# Patient Record
Sex: Female | Born: 1955
Health system: Southern US, Community
[De-identification: ages and names within clinical notes are randomized; demographics above are authoritative.]

## PROBLEM LIST (undated history)

## (undated) DIAGNOSIS — J45909 Unspecified asthma, uncomplicated: Secondary | ICD-10-CM

## (undated) DIAGNOSIS — R569 Unspecified convulsions: Secondary | ICD-10-CM

## (undated) DIAGNOSIS — H9209 Otalgia, unspecified ear: Secondary | ICD-10-CM

## (undated) DIAGNOSIS — G2581 Restless legs syndrome: Secondary | ICD-10-CM

## (undated) DIAGNOSIS — C801 Malignant (primary) neoplasm, unspecified: Secondary | ICD-10-CM

## (undated) DIAGNOSIS — E7800 Pure hypercholesterolemia, unspecified: Secondary | ICD-10-CM

## (undated) HISTORY — PX: CHOLECYSTECTOMY: SHX55

## (undated) HISTORY — PX: ABDOMINAL HYSTERECTOMY: SHX81

## (undated) HISTORY — PX: INNER EAR SURGERY: SHX679

## (undated) HISTORY — PX: NASAL SINUS SURGERY: SHX719

---

## 2004-08-07 ENCOUNTER — Emergency Department: Payer: Self-pay | Admitting: Unknown Physician Specialty

## 2004-08-19 ENCOUNTER — Emergency Department: Payer: Self-pay | Admitting: Emergency Medicine

## 2004-11-09 ENCOUNTER — Emergency Department: Payer: Self-pay | Admitting: Emergency Medicine

## 2004-11-28 ENCOUNTER — Ambulatory Visit: Payer: Self-pay | Admitting: Family Medicine

## 2005-01-21 ENCOUNTER — Emergency Department: Payer: Self-pay | Admitting: Emergency Medicine

## 2005-03-10 ENCOUNTER — Emergency Department: Payer: Self-pay | Admitting: Emergency Medicine

## 2005-05-23 ENCOUNTER — Emergency Department: Payer: Self-pay | Admitting: Emergency Medicine

## 2005-05-23 ENCOUNTER — Other Ambulatory Visit: Payer: Self-pay

## 2005-06-26 ENCOUNTER — Emergency Department: Payer: Self-pay | Admitting: Emergency Medicine

## 2005-08-08 ENCOUNTER — Other Ambulatory Visit: Payer: Self-pay

## 2005-08-08 ENCOUNTER — Emergency Department: Payer: Self-pay | Admitting: Emergency Medicine

## 2005-12-18 ENCOUNTER — Emergency Department: Payer: Self-pay | Admitting: Emergency Medicine

## 2006-01-31 ENCOUNTER — Emergency Department: Payer: Self-pay | Admitting: Unknown Physician Specialty

## 2006-04-01 ENCOUNTER — Emergency Department: Payer: Self-pay | Admitting: Emergency Medicine

## 2006-04-03 ENCOUNTER — Emergency Department: Payer: Self-pay | Admitting: Emergency Medicine

## 2006-10-05 ENCOUNTER — Emergency Department: Payer: Self-pay | Admitting: Internal Medicine

## 2007-02-10 ENCOUNTER — Other Ambulatory Visit: Payer: Self-pay

## 2007-02-10 ENCOUNTER — Emergency Department: Payer: Self-pay | Admitting: Emergency Medicine

## 2007-02-24 ENCOUNTER — Other Ambulatory Visit: Payer: Self-pay

## 2007-02-24 ENCOUNTER — Emergency Department: Payer: Self-pay | Admitting: Emergency Medicine

## 2007-03-14 ENCOUNTER — Ambulatory Visit: Payer: Self-pay | Admitting: Family Medicine

## 2007-03-22 ENCOUNTER — Ambulatory Visit: Payer: Self-pay | Admitting: Family Medicine

## 2007-12-22 IMAGING — CR DG CHEST 2V
1 series · 2 of 2 positions shown · non-contrast
Comparison: none

REASON FOR EXAM: cough chest pain
COMMENTS:   LMP: Post Hysterectomy

[Series 1: view not recorded · 0.17mm/px · 2 of 2 slices shown]
[im 1/2]
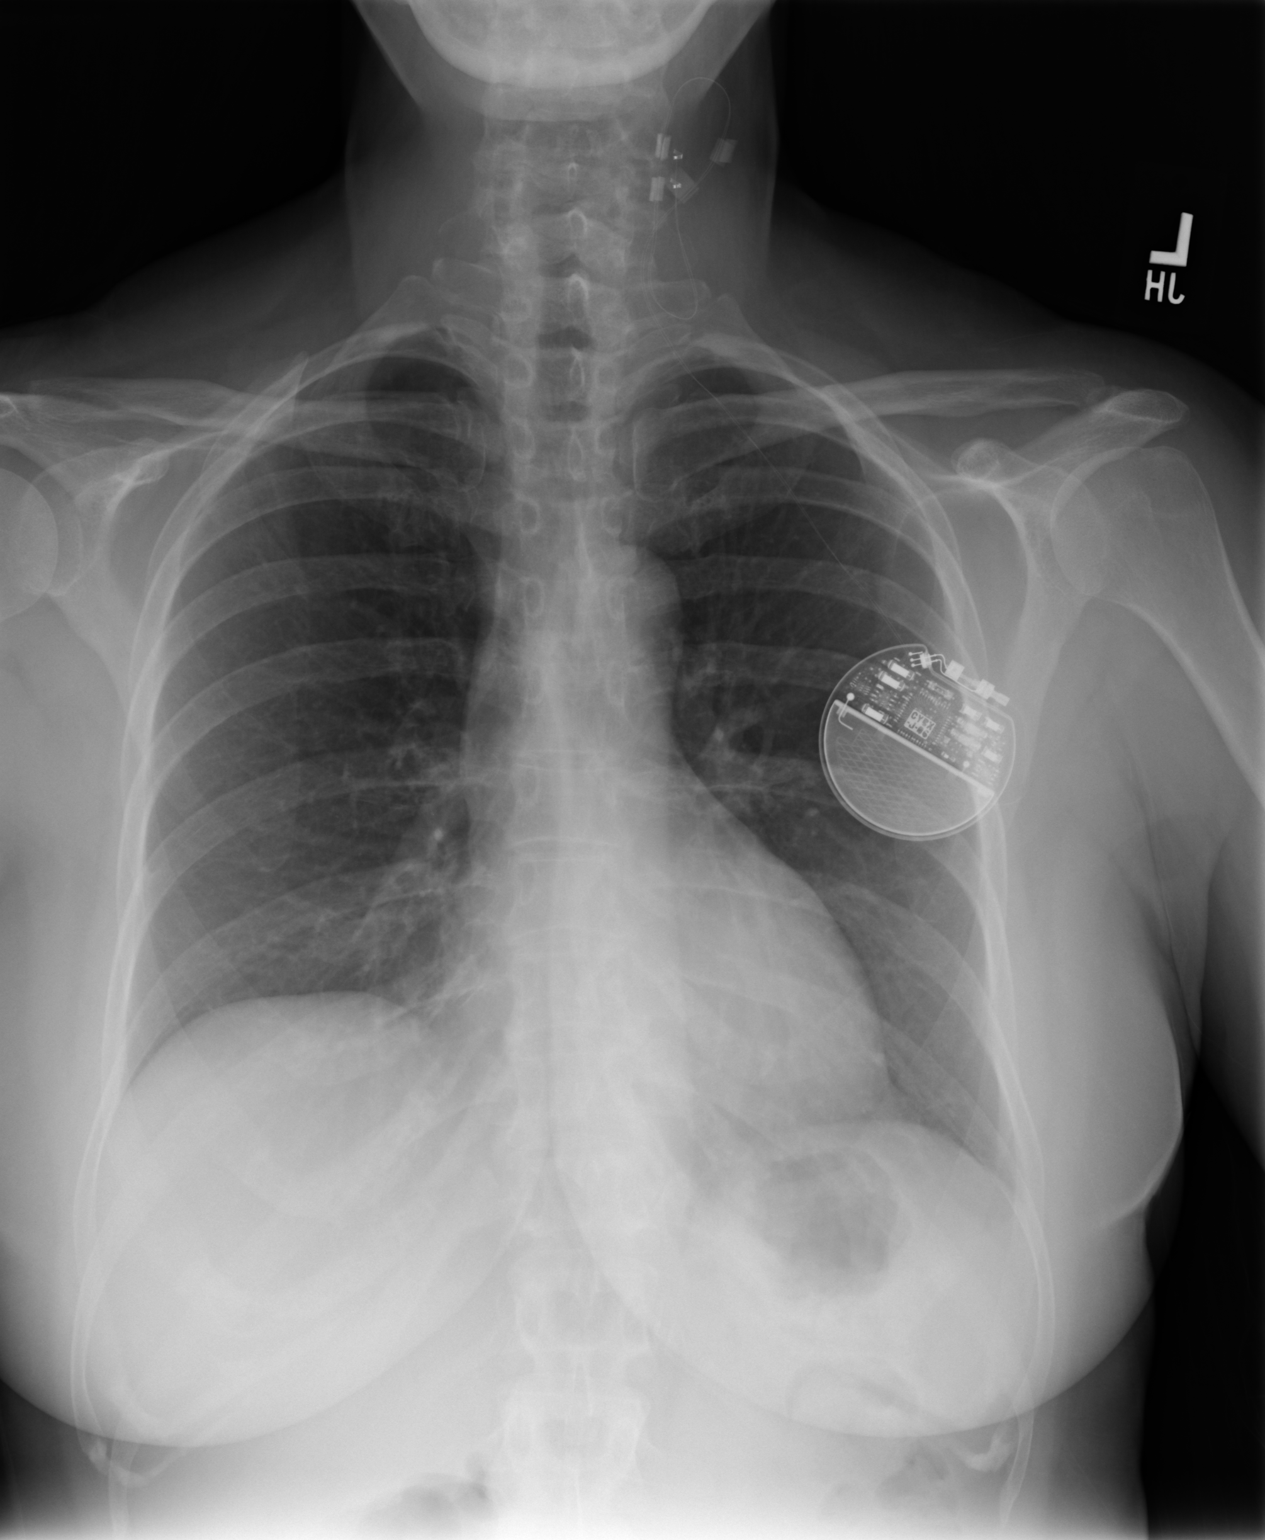
[im 2/2]
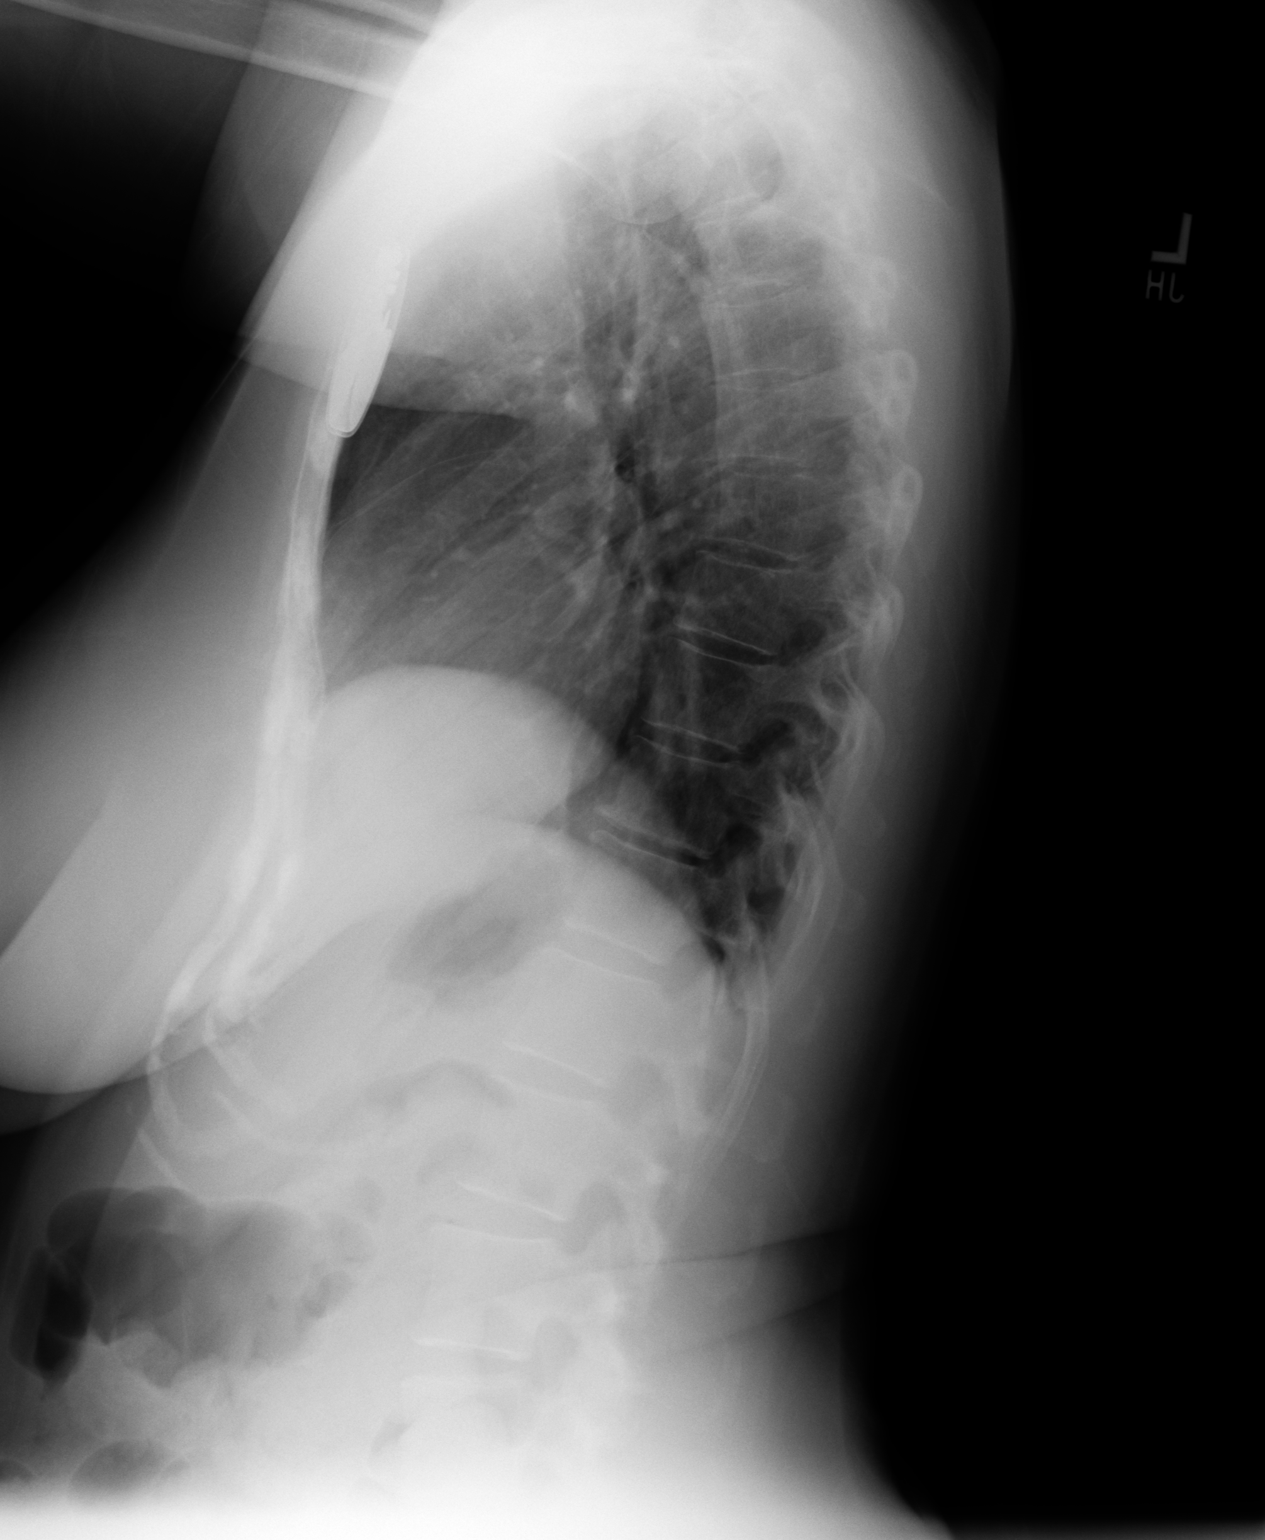

[2 of 2 positions shown; findings below may reference images not displayed]

PROCEDURE:     DXR - DXR CHEST PA (OR AP) AND LATERAL  - February 24, 2007  [DATE]

RESULT:     Comparison is made to study 11 February, 2007. The lungs are
adequately inflated and clear. The heart is not enlarged. The pulmonary
vascularity is not engorged. There is a pacemaker like generator projecting
over the LEFT upper hemithorax but there is no evidence of a pacemaker lead.
IMPRESSION: 1.I do not see evidence of acute cardiopulmonary abnormality.

## 2008-01-11 ENCOUNTER — Emergency Department: Payer: Self-pay | Admitting: Emergency Medicine

## 2008-01-12 ENCOUNTER — Ambulatory Visit: Payer: Self-pay | Admitting: Family Medicine

## 2008-01-22 ENCOUNTER — Emergency Department: Payer: Self-pay | Admitting: Emergency Medicine

## 2008-04-08 ENCOUNTER — Ambulatory Visit: Payer: Self-pay | Admitting: Emergency Medicine

## 2008-06-01 ENCOUNTER — Ambulatory Visit: Payer: Self-pay | Admitting: Family Medicine

## 2008-12-05 ENCOUNTER — Inpatient Hospital Stay: Payer: Self-pay | Admitting: Surgery

## 2008-12-23 ENCOUNTER — Ambulatory Visit: Payer: Self-pay | Admitting: Family Medicine

## 2008-12-25 ENCOUNTER — Emergency Department: Payer: Self-pay | Admitting: Unknown Physician Specialty

## 2008-12-27 ENCOUNTER — Emergency Department: Payer: Self-pay | Admitting: Emergency Medicine

## 2009-10-22 IMAGING — US US PELV - US TRANSVAGINAL
1 series · 18 of 18 positions shown · non-contrast
Comparison: none

REASON FOR EXAM: left lower abdominal pain
COMMENTS:

PROCEDURE:     US  - US PELVIS MASS EXAM W/TRANSVAGI  - December 25, 2008 [DATE]
RESULT:     The ovaries are not visualized. The patient has had a prior
hysterectomy. No hydronephrosis is noted. No pelvic fluid collections are
noted.

[Series 1: us pelv - us transvaginal · 18 of 18 slices shown]
[im 1/18]
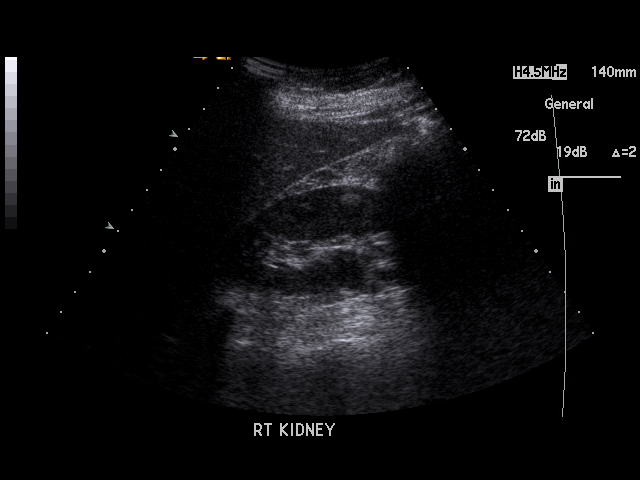
[im 2/18]
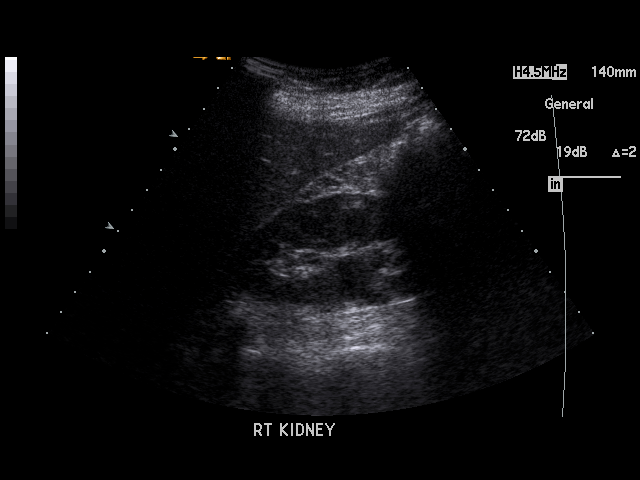
[im 3/18]
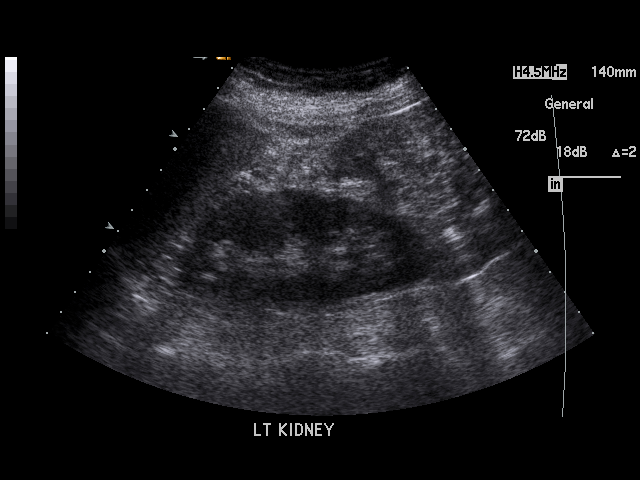
[im 4/18]
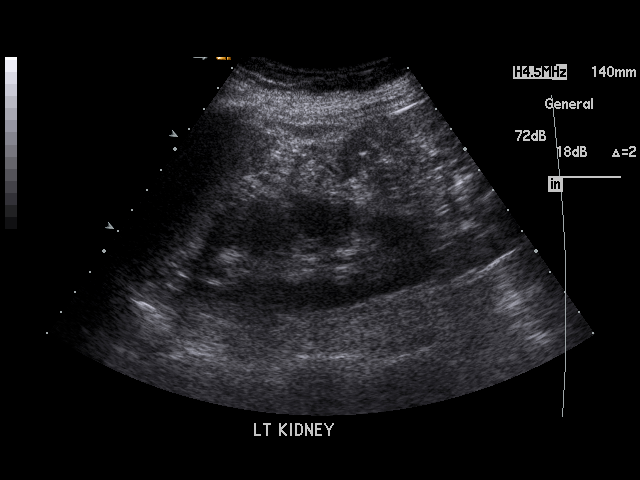
[im 5/18]
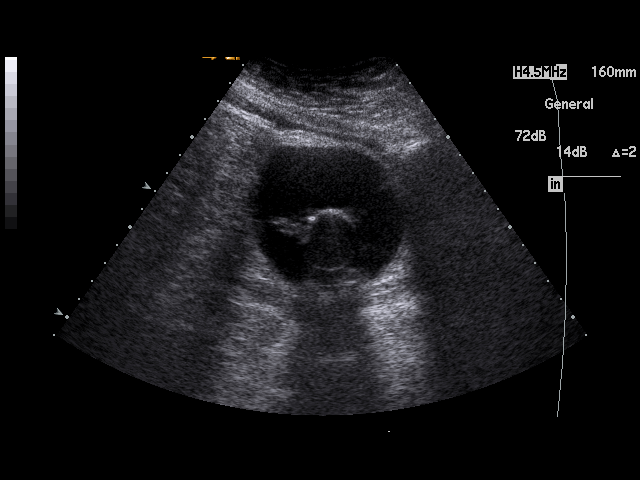
[im 6/18]
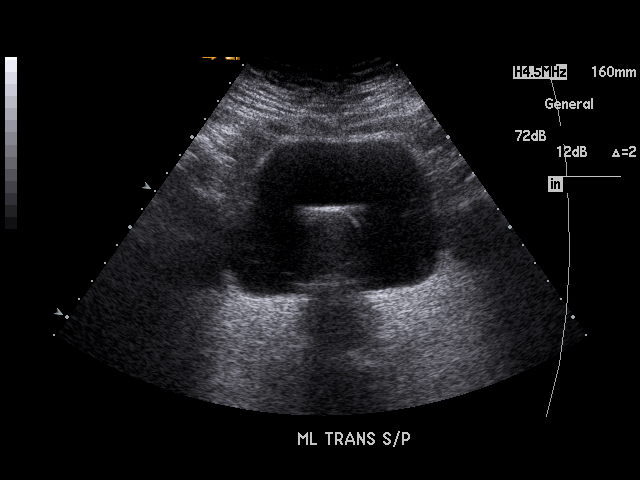
[im 7/18]
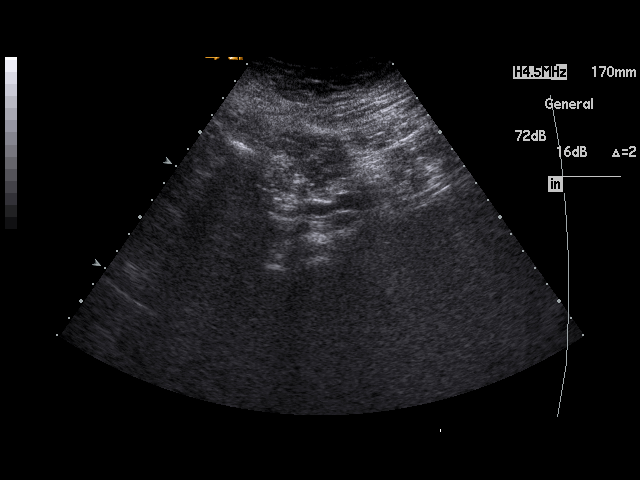
[im 8/18]
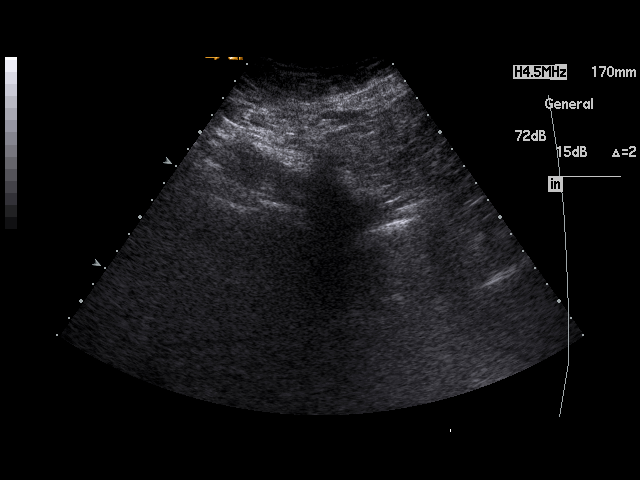
[im 9/18]
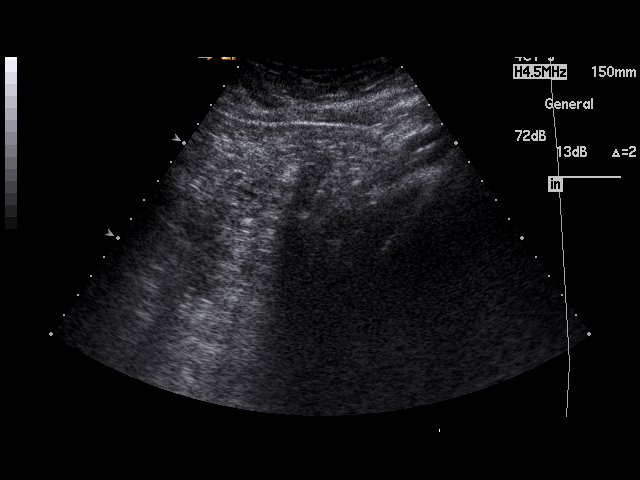
[im 10/18]
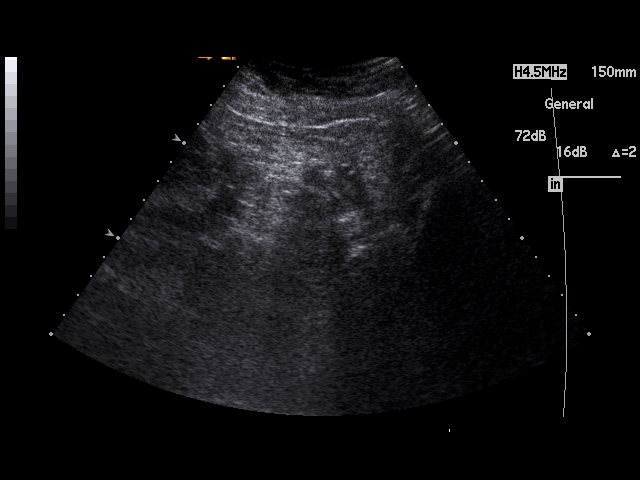
[im 11/18]
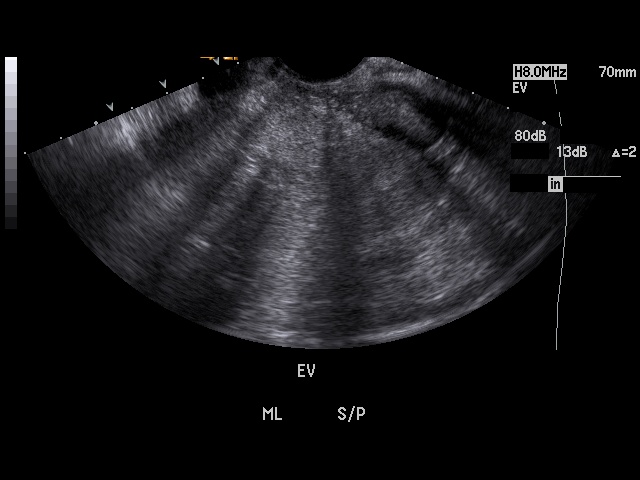
[im 12/18]
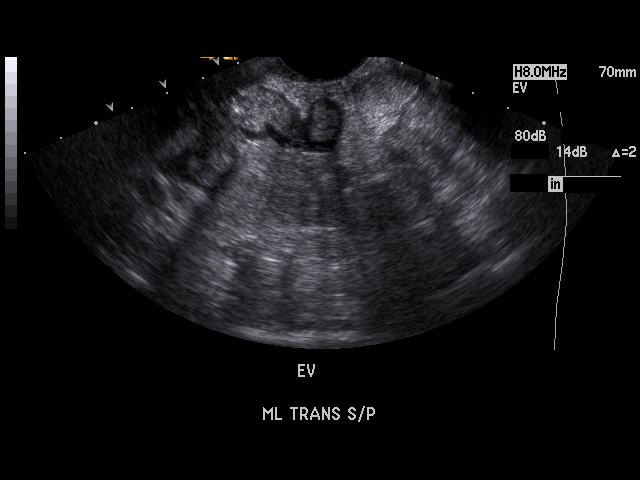
[im 13/18]
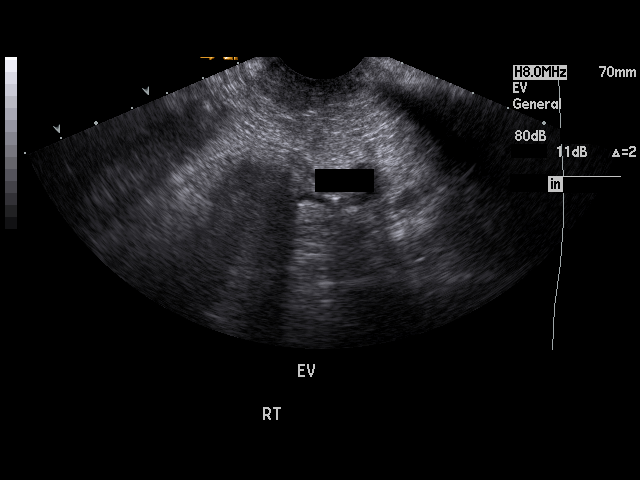
[im 14/18]
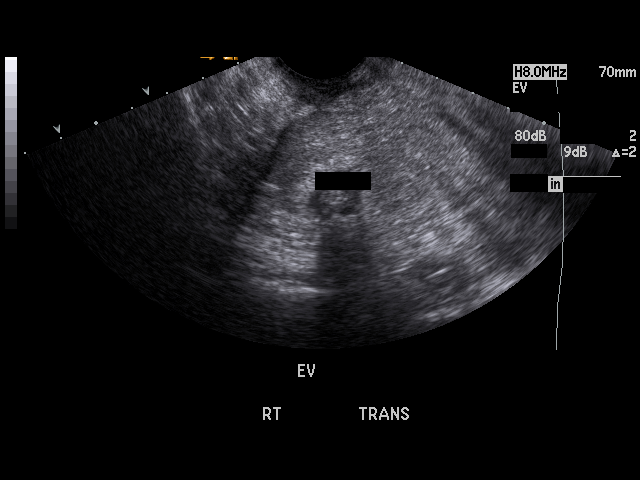
[im 15/18]
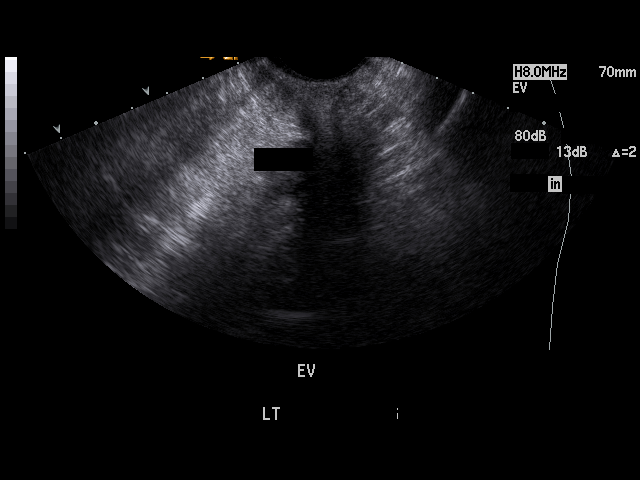
[im 16/18]
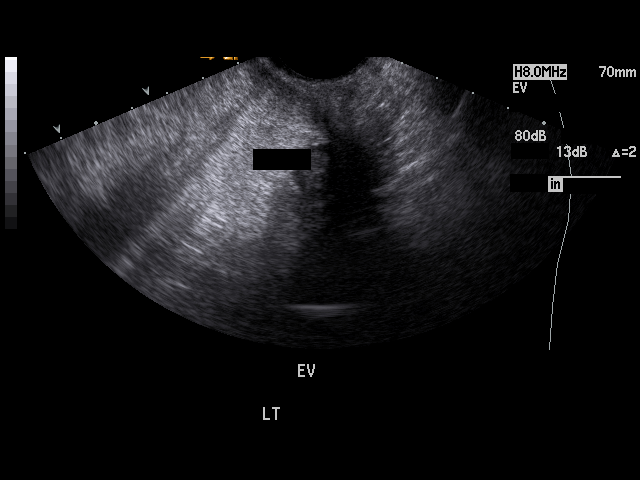
[im 17/18]
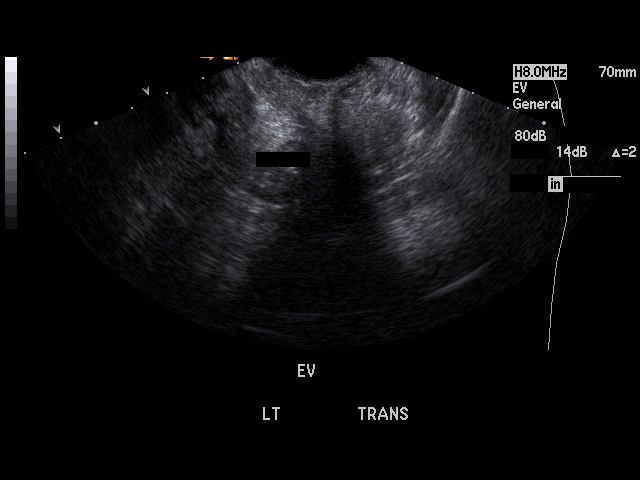
[im 18/18]
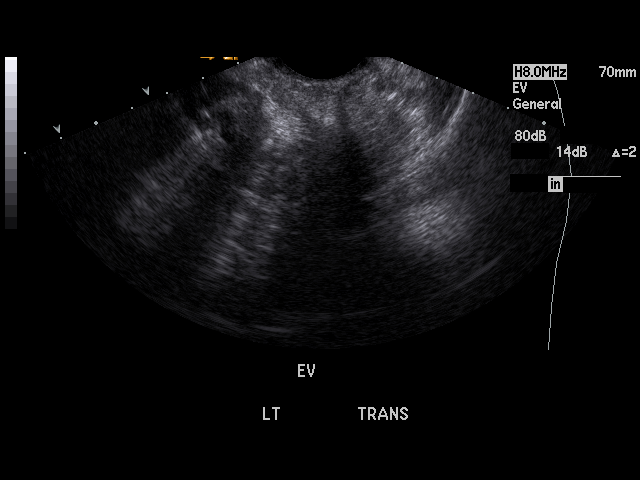

[18 of 18 positions shown; findings below may reference images not displayed]

IMPRESSION: Negative exam. The ovaries are not visualized. The patient
has had a prior hysterectomy.

## 2009-10-22 IMAGING — CT CT STONE STUDY
1 of 2 series · 15 of 32 positions shown, 19 images · non-contrast
Comparison: none

REASON FOR EXAM: lft flank pain, radiating to front
COMMENTS:

[Series 2: stone · axial · 0.68mm/px · z∈[-444,-70]mm · 15 of 142 slices shown, 19 images]
[im 11/142  soft-tissue]
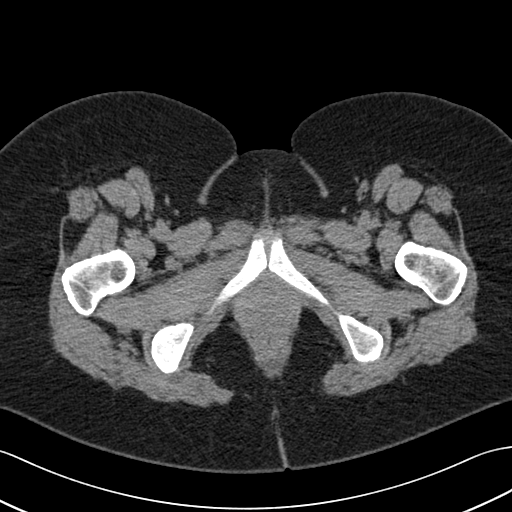
[im 11/142  bone]
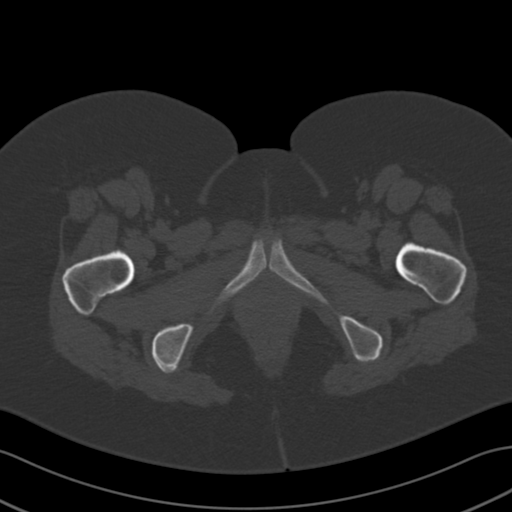
[im 21/142  soft-tissue]
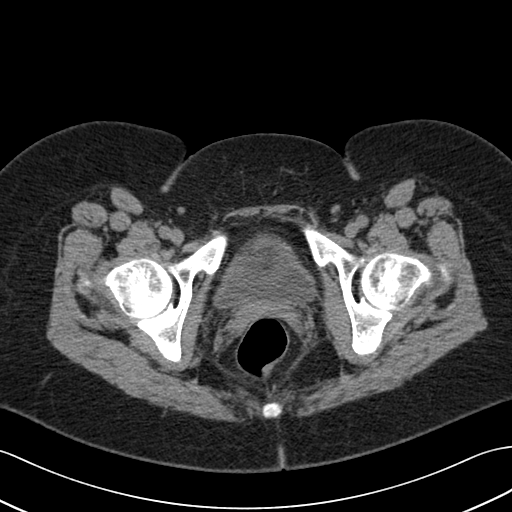
[im 32/142  soft-tissue]
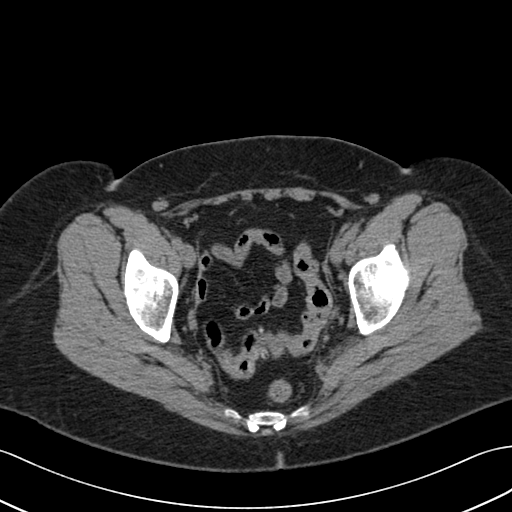
[im 42/142  soft-tissue]
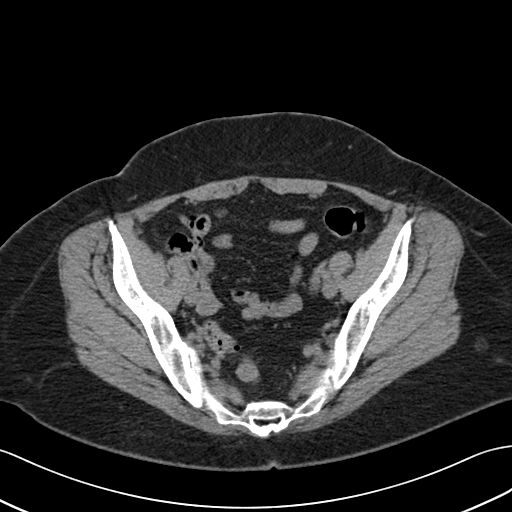
[im 53/142  soft-tissue]
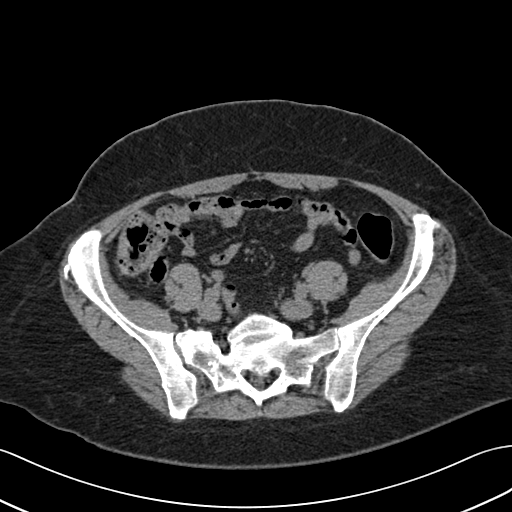
[im 63/142  soft-tissue]
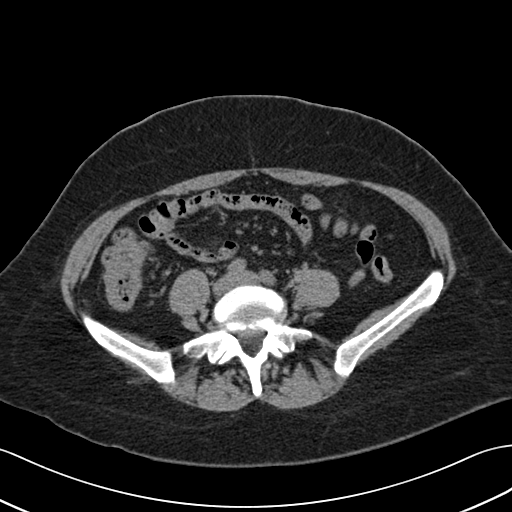
[im 74/142  soft-tissue]
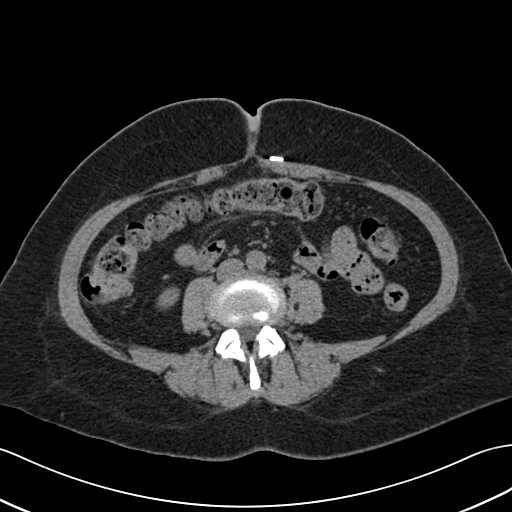
[im 84/142  soft-tissue]
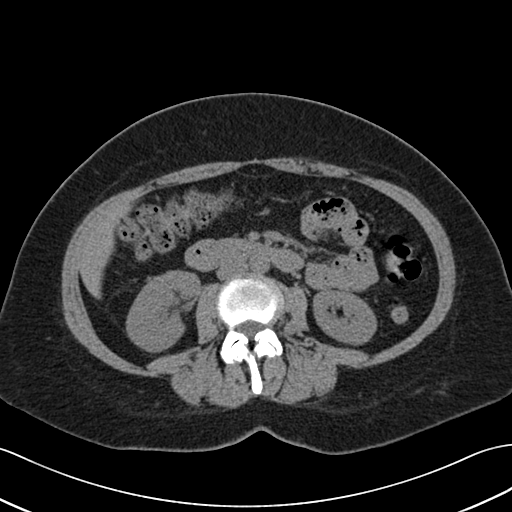
[im 95/142  soft-tissue]
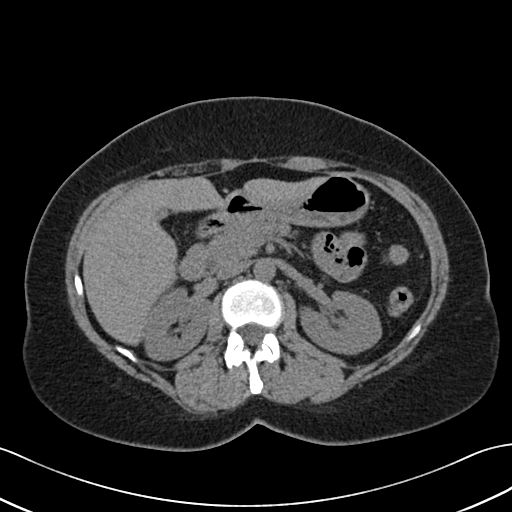
[im 95/142  bone]
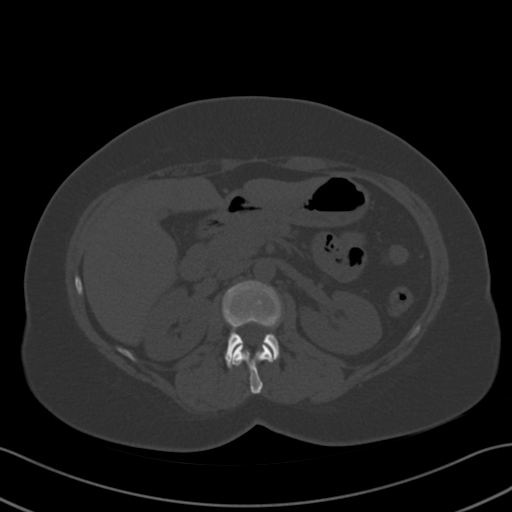
[im 105/142  soft-tissue]
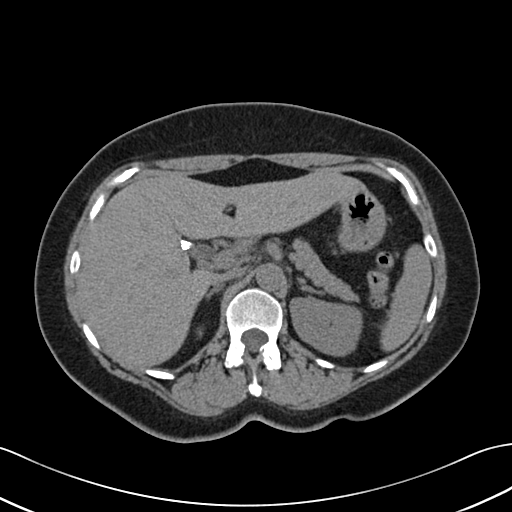
[im 115/142  soft-tissue]
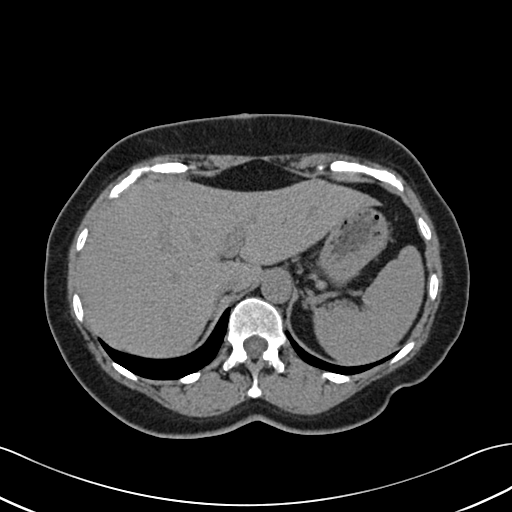
[im 121/142  lung]
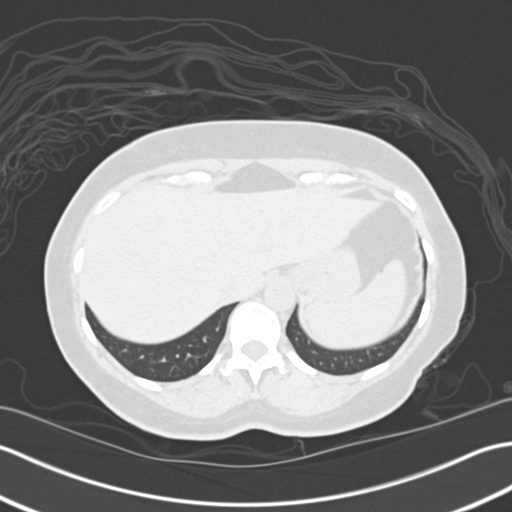
[im 126/142  soft-tissue]
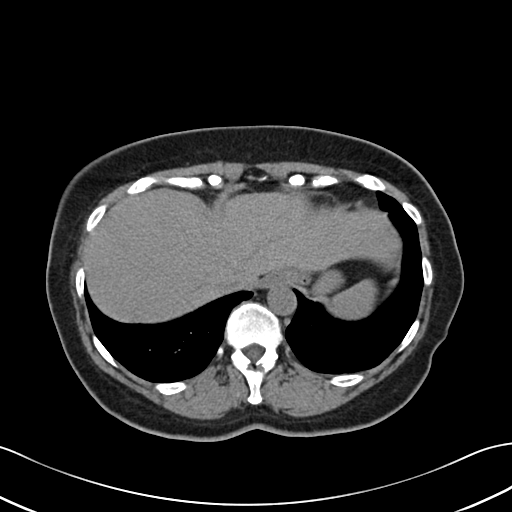
[im 126/142  lung]
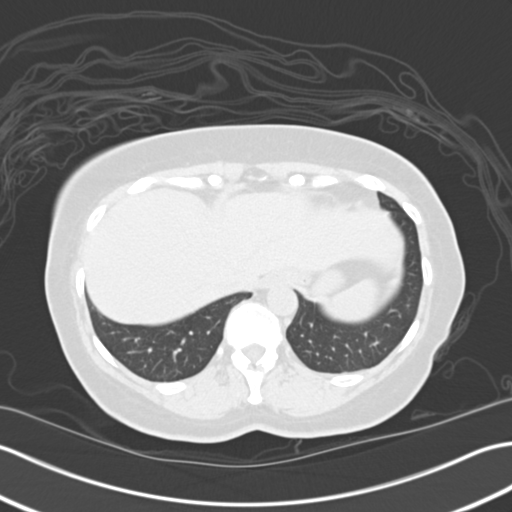
[im 131/142  lung]
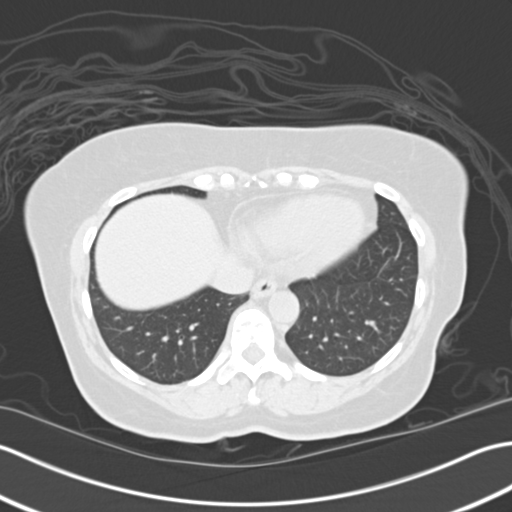
[im 136/142  soft-tissue]
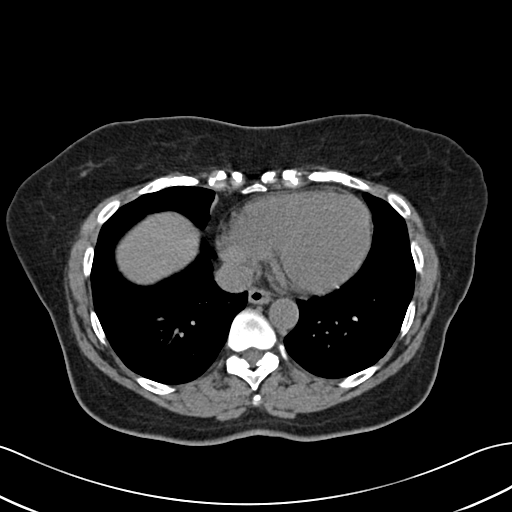
[im 136/142  lung]
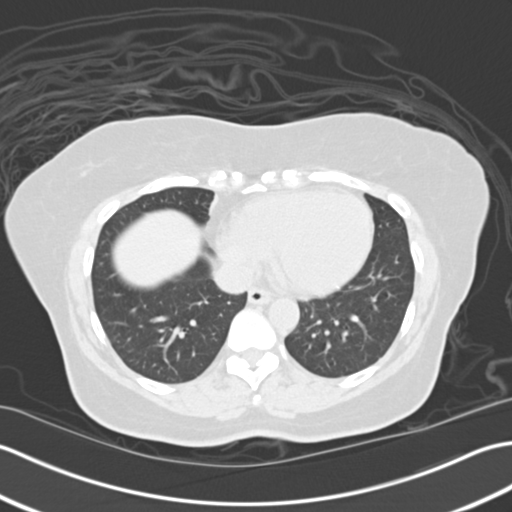

[15 of 32 positions shown; findings below may reference images not displayed]

PROCEDURE:     CT  - CT ABDOMEN /PELVIS WO (STONE)  - December 25, 2008  [DATE]

RESULT:     Axial noncontrast CT scanning was performed through the abdomen
and pelvis at 5 mm intervals and slice thicknesses. Review of 3-dimensional
reconstructed images was performed separately on the [employer] server
monitor.

The liver exhibits no focal mass nor ductal dilation. The spleen is normal
in size. There are no adrenal masses. The gallbladder is surgically absent.
The pancreas is normal in appearance. The kidneys exhibit no evidence of
obstruction nor definite evidence of stones. The caliber of the abdominal
aorta is normal. The unopacified loops of small and large bowel exhibit no
acute abnormality. There is no evidence of ascites. There is a fat
containing lesion in the upper pole of the left kidney that is most
compatible with an angiomyolipoma. The partially distended urinary bladder
is normal in appearance. I see no adnexal masses. The lung bases are clear.
The lumbar vertebral bodies are preserved in height.
IMPRESSION: 1. I do not see evidence of acute hepatobiliary abnormality. The gallbladder
is surgically absent.
2. There is no evidence of bowel obstruction nor of ileus. There are no
findings suspicious for acute diverticulitis. A few sigmoid diverticula are
present. I do not see findings suspicious for a acute appendicitis.
3. The kidneys exhibit no evidence of obstruction. An upper pole fat density
lesion on the left is compatible with a subcentimeter angiomyolipoma.

A followup contrast enhanced CT scan may be useful if the patient's clinical
findings strongly suggest occult pathology.

## 2010-05-12 ENCOUNTER — Emergency Department: Payer: Self-pay | Admitting: Emergency Medicine

## 2010-05-24 ENCOUNTER — Emergency Department: Payer: Self-pay | Admitting: Emergency Medicine

## 2010-05-26 ENCOUNTER — Ambulatory Visit: Payer: Self-pay

## 2010-05-30 ENCOUNTER — Ambulatory Visit: Payer: Self-pay | Admitting: Pain Medicine

## 2011-01-01 ENCOUNTER — Emergency Department: Payer: Self-pay | Admitting: Emergency Medicine

## 2011-01-07 ENCOUNTER — Ambulatory Visit: Payer: Self-pay | Admitting: Internal Medicine

## 2011-02-16 ENCOUNTER — Ambulatory Visit: Payer: Self-pay | Admitting: Family Medicine

## 2011-02-17 ENCOUNTER — Emergency Department: Payer: Self-pay | Admitting: Emergency Medicine

## 2011-09-15 ENCOUNTER — Ambulatory Visit: Payer: Self-pay | Admitting: Unknown Physician Specialty

## 2012-08-02 ENCOUNTER — Emergency Department: Payer: Self-pay | Admitting: Emergency Medicine

## 2012-08-04 ENCOUNTER — Ambulatory Visit: Payer: Self-pay | Admitting: Internal Medicine

## 2012-08-04 ENCOUNTER — Observation Stay: Payer: Self-pay | Admitting: Internal Medicine

## 2012-08-04 LAB — COMPREHENSIVE METABOLIC PANEL
Albumin: 4.1 g/dL (ref 3.4–5.0)
Alkaline Phosphatase: 77 U/L (ref 50–136)
Anion Gap: 11 (ref 7–16)
BUN: 16 mg/dL (ref 7–18)
Chloride: 100 mmol/L (ref 98–107)
Co2: 27 mmol/L (ref 21–32)
Creatinine: 0.85 mg/dL (ref 0.60–1.30)
EGFR (African American): >60
EGFR (Non-African Amer.): >60
Osmolality: 278 (ref 275–301)
SGOT(AST): 15 U/L (ref 15–37)
SGPT (ALT): 25 U/L (ref 12–78)

## 2012-08-04 LAB — CBC WITH DIFFERENTIAL/PLATELET
Basophil #: 0.1 10*3/uL (ref 0.0–0.1)
Eosinophil #: 0 10*3/uL (ref 0.0–0.7)
HCT: 42.5 % (ref 35.0–47.0)
HGB: 14.4 g/dL (ref 12.0–16.0)
Lymphocyte #: 1.3 10*3/uL (ref 1.0–3.6)
Lymphocyte %: 12.8 %
MCHC: 33.9 g/dL (ref 32.0–36.0)
Monocyte %: 8.2 %
Neutrophil #: 8.3 10*3/uL — ABNORMAL HIGH (ref 1.4–6.5)
Neutrophil %: 78.4 %
RDW: 13.6 % (ref 11.5–14.5)
WBC: 10.6 10*3/uL (ref 3.6–11.0)

## 2012-08-04 LAB — URINALYSIS, COMPLETE
Bilirubin,UR: NEGATIVE
Glucose,UR: NEGATIVE mg/dL (ref 0–75)
Ph: 6.5 (ref 4.5–8.0)
Specific Gravity: 1.02 (ref 1.003–1.030)

## 2012-08-04 LAB — LIPASE, BLOOD: Lipase: 78 U/L (ref 73–393)

## 2012-08-04 LAB — AMYLASE: Amylase: 36 U/L (ref 25–115)

## 2012-08-05 LAB — CBC WITH DIFFERENTIAL/PLATELET
Basophil #: 0 10*3/uL (ref 0.0–0.1)
Eosinophil %: 0.3 %
Lymphocyte #: 2.1 10*3/uL (ref 1.0–3.6)
MCH: 31.6 pg (ref 26.0–34.0)
MCV: 90 fL (ref 80–100)
Monocyte #: 0.8 x10 3/mm (ref 0.2–0.9)
Neutrophil #: 5.1 10*3/uL (ref 1.4–6.5)
Platelet: 227 10*3/uL (ref 150–440)
RBC: 3.89 10*6/uL (ref 3.80–5.20)
RDW: 13.3 % (ref 11.5–14.5)

## 2012-08-05 LAB — HEMOGLOBIN A1C: Hemoglobin A1C: 5.7 % (ref 4.2–6.3)

## 2012-08-05 LAB — BASIC METABOLIC PANEL
BUN: 14 mg/dL (ref 7–18)
Calcium, Total: 8.1 mg/dL — ABNORMAL LOW (ref 8.5–10.1)
Co2: 25 mmol/L (ref 21–32)
Creatinine: 0.73 mg/dL (ref 0.60–1.30)

## 2012-08-06 LAB — URINE CULTURE

## 2012-08-07 LAB — URINE CULTURE

## 2012-08-10 LAB — CULTURE, BLOOD (SINGLE)

## 2013-07-04 ENCOUNTER — Emergency Department: Payer: Self-pay | Admitting: Emergency Medicine

## 2013-10-10 ENCOUNTER — Ambulatory Visit: Payer: Self-pay | Admitting: Internal Medicine

## 2014-01-31 ENCOUNTER — Emergency Department: Payer: Self-pay | Admitting: Emergency Medicine

## 2014-01-31 LAB — COMPREHENSIVE METABOLIC PANEL
ALBUMIN: 3.5 g/dL (ref 3.4–5.0)
ALK PHOS: 53 U/L
ANION GAP: 6 — AB (ref 7–16)
AST: 23 U/L (ref 15–37)
BILIRUBIN TOTAL: 0.5 mg/dL (ref 0.2–1.0)
BUN: 14 mg/dL (ref 7–18)
CALCIUM: 9.4 mg/dL (ref 8.5–10.1)
Chloride: 109 mmol/L — ABNORMAL HIGH (ref 98–107)
Co2: 23 mmol/L (ref 21–32)
Creatinine: 0.74 mg/dL (ref 0.60–1.30)
EGFR (Non-African Amer.): >60
Glucose: 96 mg/dL (ref 65–99)
Osmolality: 276 (ref 275–301)
Potassium: 4.2 mmol/L (ref 3.5–5.1)
SGPT (ALT): 30 U/L (ref 12–78)
SODIUM: 138 mmol/L (ref 136–145)
Total Protein: 7.3 g/dL (ref 6.4–8.2)

## 2014-01-31 LAB — URINALYSIS, COMPLETE
BLOOD: NEGATIVE
Bacteria: NONE SEEN
Bilirubin,UR: NEGATIVE
Glucose,UR: NEGATIVE mg/dL (ref 0–75)
KETONE: NEGATIVE
Nitrite: NEGATIVE
PH: 6 (ref 4.5–8.0)
PROTEIN: NEGATIVE
Specific Gravity: 1.014 (ref 1.003–1.030)
Squamous Epithelial: 4
WBC UR: 167 /HPF (ref 0–5)

## 2014-01-31 LAB — CBC WITH DIFFERENTIAL/PLATELET
BASOS ABS: 0.1 10*3/uL (ref 0.0–0.1)
BASOS PCT: 0.9 %
Eosinophil #: 0.1 10*3/uL (ref 0.0–0.7)
Eosinophil %: 1.1 %
HCT: 43.5 % (ref 35.0–47.0)
HGB: 14.3 g/dL (ref 12.0–16.0)
LYMPHS ABS: 1.6 10*3/uL (ref 1.0–3.6)
LYMPHS PCT: 22.9 %
MCH: 29.4 pg (ref 26.0–34.0)
MCHC: 32.9 g/dL (ref 32.0–36.0)
MCV: 90 fL (ref 80–100)
MONOS PCT: 9.6 %
Monocyte #: 0.7 x10 3/mm (ref 0.2–0.9)
Neutrophil #: 4.5 10*3/uL (ref 1.4–6.5)
Neutrophil %: 65.5 %
PLATELETS: 216 10*3/uL (ref 150–440)
RBC: 4.86 10*6/uL (ref 3.80–5.20)
RDW: 14.2 % (ref 11.5–14.5)
WBC: 6.9 10*3/uL (ref 3.6–11.0)

## 2014-01-31 LAB — TROPONIN I

## 2014-02-12 ENCOUNTER — Emergency Department: Payer: Self-pay | Admitting: Emergency Medicine

## 2014-02-12 LAB — CBC
HCT: 38.8 % (ref 35.0–47.0)
HGB: 13 g/dL (ref 12.0–16.0)
MCH: 29.5 pg (ref 26.0–34.0)
MCHC: 33.4 g/dL (ref 32.0–36.0)
MCV: 88 fL (ref 80–100)
Platelet: 235 10*3/uL (ref 150–440)
RBC: 4.39 10*6/uL (ref 3.80–5.20)
RDW: 14 % (ref 11.5–14.5)
WBC: 8.9 10*3/uL (ref 3.6–11.0)

## 2014-02-12 LAB — COMPREHENSIVE METABOLIC PANEL
ALBUMIN: 3.7 g/dL (ref 3.4–5.0)
ALT: 21 U/L (ref 12–78)
Alkaline Phosphatase: 50 U/L
Anion Gap: 4 — ABNORMAL LOW (ref 7–16)
BILIRUBIN TOTAL: 0.3 mg/dL (ref 0.2–1.0)
BUN: 13 mg/dL (ref 7–18)
CALCIUM: 8.6 mg/dL (ref 8.5–10.1)
Chloride: 109 mmol/L — ABNORMAL HIGH (ref 98–107)
Co2: 27 mmol/L (ref 21–32)
Creatinine: 0.75 mg/dL (ref 0.60–1.30)
EGFR (African American): >60
GLUCOSE: 106 mg/dL — AB (ref 65–99)
OSMOLALITY: 280 (ref 275–301)
Potassium: 3.7 mmol/L (ref 3.5–5.1)
SGOT(AST): 28 U/L (ref 15–37)
SODIUM: 140 mmol/L (ref 136–145)
Total Protein: 7.6 g/dL (ref 6.4–8.2)

## 2014-02-12 LAB — TROPONIN I
Troponin-I: <0.02 ng/mL
Troponin-I: <0.02 ng/mL

## 2014-02-12 LAB — CK TOTAL AND CKMB (NOT AT ARMC)
CK, TOTAL: 141 U/L
CK-MB: 1 ng/mL (ref 0.5–3.6)

## 2014-07-12 ENCOUNTER — Ambulatory Visit: Payer: Self-pay | Admitting: Family Medicine

## 2014-07-12 LAB — URINALYSIS, COMPLETE
BILIRUBIN, UR: NEGATIVE
Blood: NEGATIVE
Glucose,UR: NEGATIVE
Ketone: NEGATIVE
NITRITE: NEGATIVE
PROTEIN: NEGATIVE
Ph: 8.5 (ref 5.0–8.0)
RBC,UR: NONE SEEN /HPF (ref 0–5)
SPECIFIC GRAVITY: 1.02 (ref 1.000–1.030)

## 2014-07-14 ENCOUNTER — Emergency Department: Payer: Self-pay | Admitting: Emergency Medicine

## 2014-07-14 LAB — URINALYSIS, COMPLETE
BLOOD: NEGATIVE
Bacteria: NONE SEEN
Bilirubin,UR: NEGATIVE
Glucose,UR: NEGATIVE mg/dL (ref 0–75)
KETONE: NEGATIVE
NITRITE: NEGATIVE
Ph: 5 (ref 4.5–8.0)
Protein: NEGATIVE
RBC,UR: 3 /HPF (ref 0–5)
Specific Gravity: 1.014 (ref 1.003–1.030)
WBC UR: 52 /HPF (ref 0–5)

## 2014-07-14 LAB — CBC WITH DIFFERENTIAL/PLATELET
BASOS PCT: 0.3 %
Basophil #: 0 10*3/uL (ref 0.0–0.1)
EOS PCT: 1.9 %
Eosinophil #: 0.1 10*3/uL (ref 0.0–0.7)
HCT: 40.5 % (ref 35.0–47.0)
HGB: 13 g/dL (ref 12.0–16.0)
LYMPHS ABS: 1.5 10*3/uL (ref 1.0–3.6)
Lymphocyte %: 22.7 %
MCH: 29.1 pg (ref 26.0–34.0)
MCHC: 32 g/dL (ref 32.0–36.0)
MCV: 91 fL (ref 80–100)
MONOS PCT: 7.8 %
Monocyte #: 0.5 x10 3/mm (ref 0.2–0.9)
NEUTROS PCT: 67.3 %
Neutrophil #: 4.3 10*3/uL (ref 1.4–6.5)
Platelet: 246 10*3/uL (ref 150–440)
RBC: 4.45 10*6/uL (ref 3.80–5.20)
RDW: 14.2 % (ref 11.5–14.5)
WBC: 6.4 10*3/uL (ref 3.6–11.0)

## 2014-07-14 LAB — BASIC METABOLIC PANEL
Anion Gap: 7 (ref 7–16)
BUN: 16 mg/dL (ref 7–18)
CALCIUM: 9.3 mg/dL (ref 8.5–10.1)
CO2: 24 mmol/L (ref 21–32)
Chloride: 109 mmol/L — ABNORMAL HIGH (ref 98–107)
Creatinine: 0.59 mg/dL — ABNORMAL LOW (ref 0.60–1.30)
EGFR (African American): >60
EGFR (Non-African Amer.): >60
Glucose: 132 mg/dL — ABNORMAL HIGH (ref 65–99)
OSMOLALITY: 282 (ref 275–301)
Potassium: 4.8 mmol/L (ref 3.5–5.1)
SODIUM: 140 mmol/L (ref 136–145)

## 2014-07-14 LAB — URINE CULTURE
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
## 2015-02-23 NOTE — Discharge Summary (Signed)
PATIENT NAME:  Cynthia Fisher, Cynthia Fisher MR#:  [id number] DATE OF BIRTH:  04-29-1956  DATE OF ADMISSION:  08/04/2012 DATE OF DISCHARGE:  08/06/2012  DISCHARGE DIAGNOSES:  1. Escherichia coli urinary tract infection.  2. Bacterial vaginosis.  3. Seizure disorder.  4. Dehydration.   IMAGING STUDIES: CT scan of the abdomen showed no acute abnormalities.   CONSULTS: Gynecology, Dr. Kenton Kingfisher    ADMITTING HISTORY AND PHYSICAL: Please see detailed history and physical dictated on 08/04/2012. In brief, the patient is a 59 year old female patient with history of asthma, TIA, seizure disorder, and irritable bowel syndrome who presented to the Emergency Room sent in from her primary care physician's office with abdominal pain, nausea, vomiting, and urinary tract infection for concern of pyelonephritis.   HOSPITAL COURSE: The patient had Escherichia coli grow from her urine which was sensitive to ciprofloxacin which the patient was discharged home on. The patient was seen by Gynecology for vaginal discharge and was diagnosed with bacterial vaginosis and recommended Flagyl for a week which the patient has been given a prescription for at the time of discharge. The patient's nausea and vomiting had resolved on the day of discharge. She tolerated her oral food, was feeling better, afebrile with blood pressure 136/85, saturating 100% on room air with normal abdominal examination and was discharged home in a stable condition.   DISCHARGE MEDICATIONS:  1. Ciprofloxacin 500 mg oral twice a day for five days.  2. Flagyl 500 mg oral twice a day for five days.  3. Zofran 4 mg 4 times a day as needed for nausea.  4. Amitiza 24 mcg oral twice a day.  5. Hydroxyzine 1 capsule oral 3 times a day as needed.  6. Albuterol 2 puffs inhaled four times a day as needed.  7. Simvastatin 40 mg oral once a day.  8. Pramipexole 1 mg 2 tablets oral 2 times a day.  9. Venlafaxine 150 mg oral once a day.  10. Topiramate 100 mg oral 2  times a day.   DISCHARGE INSTRUCTIONS:  1. Follow-up with primary care physician, Dr. Clemmie Krill, in a week.  2. She is to use all her medications as prescribed.  3. She was discharged on a regular diet with activity as tolerated.   This plan was discussed with the patient who has verbalized understanding and is okay with the plan.   TIME SPENT IN COORDINATION OF DISCHARGE: 40 minutes.   ____________________________ Leia Alf Oliviah Agostini, MD srs:drc D: 08/08/2012 11:19:59 ET T: 08/08/2012 13:25:48 ET JOB#: [id number]  cc: Alveta Heimlich R. Darvin Neighbours, MD, <Dictator> Valetta Close, MD Neita Carp MD ELECTRONICALLY SIGNED 08/09/2012 7:46

## 2015-08-04 ENCOUNTER — Other Ambulatory Visit: Payer: Self-pay | Admitting: Family Medicine

## 2015-08-04 DIAGNOSIS — Z1231 Encounter for screening mammogram for malignant neoplasm of breast: Secondary | ICD-10-CM

## 2015-08-14 ENCOUNTER — Encounter: Payer: Self-pay | Admitting: Emergency Medicine

## 2015-08-14 ENCOUNTER — Ambulatory Visit: Payer: Medicare Other

## 2015-08-14 ENCOUNTER — Ambulatory Visit
Admission: EM | Admit: 2015-08-14 | Discharge: 2015-08-14 | Disposition: A | Discharge status: Home / Self Care | Payer: Medicare Other | Attending: Family Medicine | Admitting: Family Medicine

## 2015-08-14 DIAGNOSIS — J209 Acute bronchitis, unspecified: Secondary | ICD-10-CM | POA: Diagnosis not present

## 2015-08-14 DIAGNOSIS — H6593 Unspecified nonsuppurative otitis media, bilateral: Secondary | ICD-10-CM | POA: Diagnosis not present

## 2015-08-14 DIAGNOSIS — R05 Cough: Secondary | ICD-10-CM | POA: Diagnosis present

## 2015-08-14 DIAGNOSIS — R509 Fever, unspecified: Secondary | ICD-10-CM | POA: Diagnosis present

## 2015-08-14 DIAGNOSIS — J0100 Acute maxillary sinusitis, unspecified: Secondary | ICD-10-CM

## 2015-08-14 DIAGNOSIS — G2581 Restless legs syndrome: Secondary | ICD-10-CM | POA: Insufficient documentation

## 2015-08-14 DIAGNOSIS — K529 Noninfective gastroenteritis and colitis, unspecified: Secondary | ICD-10-CM | POA: Diagnosis not present

## 2015-08-14 DIAGNOSIS — J45909 Unspecified asthma, uncomplicated: Secondary | ICD-10-CM | POA: Insufficient documentation

## 2015-08-14 HISTORY — DX: Unspecified asthma, uncomplicated: J45.909

## 2015-08-14 HISTORY — DX: Otalgia, unspecified ear: H92.09

## 2015-08-14 HISTORY — DX: Restless legs syndrome: G25.81

## 2015-08-14 HISTORY — DX: Unspecified convulsions: R56.9

## 2015-08-14 MED ORDER — ONDANSETRON 8 MG PO TBDP: 8.0000 mg | ORAL_TABLET | Freq: Two times a day (BID) | ORAL | Status: DC | PRN

## 2015-08-14 MED ORDER — BENZONATATE 200 MG PO CAPS: 200.0000 mg | ORAL_CAPSULE | Freq: Three times a day (TID) | ORAL | Status: DC | PRN

## 2015-08-14 MED ORDER — AMOXICILLIN-POT CLAVULANATE 875-125 MG PO TABS: 1.0000 | ORAL_TABLET | Freq: Two times a day (BID) | ORAL | Status: DC

## 2015-08-14 MED ORDER — IPRATROPIUM-ALBUTEROL 0.5-2.5 (3) MG/3ML IN SOLN
3.0000 mL | Freq: Once | RESPIRATORY_TRACT | Status: AC
  Administered 2015-08-14: 3 mL via RESPIRATORY_TRACT

## 2015-08-14 MED ORDER — SALINE SPRAY 0.65 % NA SOLN: 2.0000 | NASAL | Status: DC

## 2015-08-14 MED ORDER — ONDANSETRON 8 MG PO TBDP
8.0000 mg | ORAL_TABLET | Freq: Once | ORAL | Status: AC
  Administered 2015-08-14: 8 mg via ORAL

## 2015-08-14 MED ORDER — PREDNISONE 50 MG PO TABS: ORAL_TABLET | ORAL | Status: AC

## 2015-08-14 MED ORDER — ALBUTEROL SULFATE HFA 108 (90 BASE) MCG/ACT IN AERS: 1.0000 | INHALATION_SPRAY | Freq: Four times a day (QID) | RESPIRATORY_TRACT | Status: DC | PRN

## 2015-08-14 NOTE — ED Notes (Signed)
Pt reports fever cough for 2 days. Vomiting last night.

## 2015-08-14 NOTE — Discharge Instructions (Signed)
Acute Bronchitis °Bronchitis is inflammation of the airways that extend from the windpipe into the lungs (bronchi). The inflammation often causes mucus to develop. This leads to a cough, which is the most common symptom of bronchitis.  °In acute bronchitis, the condition usually develops suddenly and goes away over time, usually in a couple weeks. Smoking, allergies, and asthma can make bronchitis worse. Repeated episodes of bronchitis may cause further lung problems.  °CAUSES °Acute bronchitis is most often caused by the same virus that causes a cold. The virus can spread from person to person (contagious) through coughing, sneezing, and touching contaminated objects. °SIGNS AND SYMPTOMS  °· Cough.   °· Fever.   °· Coughing up mucus.   °· Body aches.   °· Chest congestion.   °· Chills.   °· Shortness of breath.   °· Sore throat.   °DIAGNOSIS  °Acute bronchitis is usually diagnosed through a physical exam. Your health care provider will also ask you questions about your medical history. Tests, such as chest X-rays, are sometimes done to rule out other conditions.  °TREATMENT  °Acute bronchitis usually goes away in a couple weeks. Oftentimes, no medical treatment is necessary. Medicines are sometimes given for relief of fever or cough. Antibiotic medicines are usually not needed but may be prescribed in certain situations. In some cases, an inhaler may be recommended to help reduce shortness of breath and control the cough. A cool mist vaporizer may also be used to help thin bronchial secretions and make it easier to clear the chest.  °HOME CARE INSTRUCTIONS °· Get plenty of rest.   °· Drink enough fluids to keep your urine clear or pale yellow (unless you have a medical condition that requires fluid restriction). Increasing fluids may help thin your respiratory secretions (sputum) and reduce chest congestion, and it will prevent dehydration.   °· Take medicines only as directed by your health care provider. °· If  you were prescribed an antibiotic medicine, finish it all even if you start to feel better. °· Avoid smoking and secondhand smoke. Exposure to cigarette smoke or irritating chemicals will make bronchitis worse. If you are a smoker, consider using nicotine gum or skin patches to help control withdrawal symptoms. Quitting smoking will help your lungs heal faster.   °· Reduce the chances of another bout of acute bronchitis by washing your hands frequently, avoiding people with cold symptoms, and trying not to touch your hands to your mouth, nose, or eyes.   °· Keep all follow-up visits as directed by your health care provider.   °SEEK MEDICAL CARE IF: °Your symptoms do not improve after 1 week of treatment.  °SEEK IMMEDIATE MEDICAL CARE IF: °· You develop an increased fever or chills.   °· You have chest pain.   °· You have severe shortness of breath. °· You have bloody sputum.   °· You develop dehydration. °· You faint or repeatedly feel like you are going to pass out. °· You develop repeated vomiting. °· You develop a severe headache. °MAKE SURE YOU:  °· Understand these instructions. °· Will watch your condition. °· Will get help right away if you are not doing well or get worse. °  °This information is not intended to replace advice given to you by your health care provider. Make sure you discuss any questions you have with your health care provider. °  °Document Released: 11/30/2004 Document Revised: 11/13/2014 Document Reviewed: 04/15/2013 °Elsevier Interactive Patient Education ©2016 [employer] °Otitis Media With Effusion °Otitis media with effusion is the presence of fluid in the middle ear. This is   a common problem in children, which often follows ear infections. It may be present for weeks or longer after the infection. Unlike an acute ear infection, otitis media with effusion refers only to fluid behind the ear drum and not infection. Children with repeated ear and sinus infections and allergy problems  are the most likely to get otitis media with effusion. CAUSES  The most frequent cause of the fluid buildup is dysfunction of the eustachian tubes. These are the tubes that drain fluid in the ears to the back of the nose (nasopharynx). SYMPTOMS   The main symptom of this condition is hearing loss. As a result, you or your child may:  Listen to the TV at a loud volume.  Not respond to questions.  Ask "what" often when spoken to.  Mistake or confuse one sound or word for another.  There may be a sensation of fullness or pressure but usually not pain. DIAGNOSIS   Your health care provider will diagnose this condition by examining you or your child's ears.  Your health care provider may test the pressure in you or your child's ear with a tympanometer.  A hearing test may be conducted if the problem persists. TREATMENT   Treatment depends on the duration and the effects of the effusion.  Antibiotics, decongestants, nose drops, and cortisone-type drugs (tablets or nasal spray) may not be helpful.  Children with persistent ear effusions may have delayed language or behavioral problems. Children at risk for developmental delays in hearing, learning, and speech may require referral to a specialist earlier than children not at risk.  You or your child's health care provider may suggest a referral to an ear, nose, and throat surgeon for treatment. The following may help restore normal hearing:  Drainage of fluid.  Placement of ear tubes (tympanostomy tubes).  Removal of adenoids (adenoidectomy). HOME CARE INSTRUCTIONS   Avoid secondhand smoke.  Infants who are breastfed are less likely to have this condition.  Avoid feeding infants while they are lying flat.  Avoid known environmental allergens.  Avoid people who are sick. SEEK MEDICAL CARE IF:   Hearing is not better in 3 months.  Hearing is worse.  Ear pain.  Drainage from the ear.  Dizziness. MAKE SURE YOU:    Understand these instructions.  Will watch your condition.  Will get help right away if you are not doing well or get worse.   This information is not intended to replace advice given to you by your health care provider. Make sure you discuss any questions you have with your health care provider.   Document Released: 11/30/2004 Document Revised: 11/13/2014 Document Reviewed: 05/20/2013 Elsevier Interactive Patient Education 2016 [employer] Sinusitis, Adult Sinusitis is redness, soreness, and inflammation of the paranasal sinuses. Paranasal sinuses are air pockets within the bones of your face. They are located beneath your eyes, in the middle of your forehead, and above your eyes. In healthy paranasal sinuses, mucus is able to drain out, and air is able to circulate through them by way of your nose. However, when your paranasal sinuses are inflamed, mucus and air can become trapped. This can allow bacteria and other germs to grow and cause infection. Sinusitis can develop quickly and last only a short time (acute) or continue over a long period (chronic). Sinusitis that lasts for more than 12 weeks is considered chronic. CAUSES Causes of sinusitis include:  Allergies.  Structural abnormalities, such as displacement of the cartilage that separates your nostrils (deviated septum),  which can decrease the air flow through your nose and sinuses and affect sinus drainage.  Functional abnormalities, such as when the small hairs (cilia) that line your sinuses and help remove mucus do not work properly or are not present. SIGNS AND SYMPTOMS Symptoms of acute and chronic sinusitis are the same. The primary symptoms are pain and pressure around the affected sinuses. Other symptoms include:  Upper toothache.  Earache.  Headache.  Bad breath.  Decreased sense of smell and taste.  A cough, which worsens when you are lying flat.  Fatigue.  Fever.  Thick drainage from your nose, which  often is green and may contain pus (purulent).  Swelling and warmth over the affected sinuses. DIAGNOSIS Your health care provider will perform a physical exam. During your exam, your health care provider may perform any of the following to help determine if you have acute sinusitis or chronic sinusitis:  Look in your nose for signs of abnormal growths in your nostrils (nasal polyps).  Tap over the affected sinus to check for signs of infection.  View the inside of your sinuses using an imaging device that has a light attached (endoscope). If your health care provider suspects that you have chronic sinusitis, one or more of the following tests may be recommended:  Allergy tests.  Nasal culture. A sample of mucus is taken from your nose, sent to a lab, and screened for bacteria.  Nasal cytology. A sample of mucus is taken from your nose and examined by your health care provider to determine if your sinusitis is related to an allergy. TREATMENT Most cases of acute sinusitis are related to a viral infection and will resolve on their own within 10 days. Sometimes, medicines are prescribed to help relieve symptoms of both acute and chronic sinusitis. These may include pain medicines, decongestants, nasal steroid sprays, or saline sprays. However, for sinusitis related to a bacterial infection, your health care provider will prescribe antibiotic medicines. These are medicines that will help kill the bacteria causing the infection. Rarely, sinusitis is caused by a fungal infection. In these cases, your health care provider will prescribe antifungal medicine. For some cases of chronic sinusitis, surgery is needed. Generally, these are cases in which sinusitis recurs more than 3 times per year, despite other treatments. HOME CARE INSTRUCTIONS  Drink plenty of water. Water helps thin the mucus so your sinuses can drain more easily.  Use a humidifier.  Inhale steam 3-4 times a day (for example, sit  in the bathroom with the shower running).  Apply a warm, moist washcloth to your face 3-4 times a day, or as directed by your health care provider.  Use saline nasal sprays to help moisten and clean your sinuses.  Take medicines only as directed by your health care provider.  If you were prescribed either an antibiotic or antifungal medicine, finish it all even if you start to feel better. SEEK IMMEDIATE MEDICAL CARE IF:  You have increasing pain or severe headaches.  You have nausea, vomiting, or drowsiness.  You have swelling around your face.  You have vision problems.  You have a stiff neck.  You have difficulty breathing.   This information is not intended to replace advice given to you by your health care provider. Make sure you discuss any questions you have with your health care provider.   Document Released: 10/23/2005 Document Revised: 11/13/2014 Document Reviewed: 11/07/2011 Elsevier Interactive Patient Education 2016 [employer]. Viral Gastroenteritis Viral gastroenteritis is also known as  stomach flu. This condition affects the stomach and intestinal tract. It can cause sudden diarrhea and vomiting. The illness typically lasts 3 to 8 days. Most people develop an immune response that eventually gets rid of the virus. While this natural response develops, the virus can make you quite ill. CAUSES  Many different viruses can cause gastroenteritis, such as rotavirus or noroviruses. You can catch one of these viruses by consuming contaminated food or water. You may also catch a virus by sharing utensils or other personal items with an infected person or by touching a contaminated surface. SYMPTOMS  The most common symptoms are diarrhea and vomiting. These problems can cause a severe loss of body fluids (dehydration) and a body salt (electrolyte) imbalance. Other symptoms may include:  Fever.  Headache.  Fatigue.  Abdominal pain. DIAGNOSIS  Your caregiver can usually  diagnose viral gastroenteritis based on your symptoms and a physical exam. A stool sample may also be taken to test for the presence of viruses or other infections. TREATMENT  This illness typically goes away on its own. Treatments are aimed at rehydration. The most serious cases of viral gastroenteritis involve vomiting so severely that you are not able to keep fluids down. In these cases, fluids must be given through an intravenous line (IV). HOME CARE INSTRUCTIONS   Drink enough fluids to keep your urine clear or pale yellow. Drink small amounts of fluids frequently and increase the amounts as tolerated.  Ask your caregiver for specific rehydration instructions.  Avoid:  Foods high in sugar.  Alcohol.  Carbonated drinks.  Tobacco.  Juice.  Caffeine drinks.  Extremely hot or cold fluids.  Fatty, greasy foods.  Too much intake of anything at one time.  Dairy products until 24 to 48 hours after diarrhea stops.  You may consume probiotics. Probiotics are active cultures of beneficial bacteria. They may lessen the amount and number of diarrheal stools in adults. Probiotics can be found in yogurt with active cultures and in supplements.  Wash your hands well to avoid spreading the virus.  Only take over-the-counter or prescription medicines for pain, discomfort, or fever as directed by your caregiver. Do not give aspirin to children. Antidiarrheal medicines are not recommended.  Ask your caregiver if you should continue to take your regular prescribed and over-the-counter medicines.  Keep all follow-up appointments as directed by your caregiver. SEEK IMMEDIATE MEDICAL CARE IF:   You are unable to keep fluids down.  You do not urinate at least once every 6 to 8 hours.  You develop shortness of breath.  You notice blood in your stool or vomit. This may look like coffee grounds.  You have abdominal pain that increases or is concentrated in one small area  (localized).  You have persistent vomiting or diarrhea.  You have a fever.  The patient is a child younger than 3 months, and he or she has a fever.  The patient is a child older than 3 months, and he or she has a fever and persistent symptoms.  The patient is a child older than 3 months, and he or she has a fever and symptoms suddenly get worse.  The patient is a baby, and he or she has no tears when crying. MAKE SURE YOU:   Understand these instructions.  Will watch your condition.  Will get help right away if you are not doing well or get worse.   This information is not intended to replace advice given to you by your health  care provider. Make sure you discuss any questions you have with your health care provider.   Document Released: 10/23/2005 Document Revised: 01/15/2012 Document Reviewed: 08/09/2011 Elsevier Interactive Patient Education [employer].

## 2015-08-15 NOTE — ED Provider Notes (Signed)
CSN: [id number]     Arrival date & time 08/14/15  1052 History   First MD Initiated Contact with Patient 08/14/15 1105     Chief Complaint  Patient presents with  . Fever  . Cough   (Consider location/radiation/quality/duration/timing/severity/associated sxs/prior Treatment) HPI Comments: Widowed caucasian female here for evaluation cough, wheezing, fever, vomiting, chest congestion.  Grandchild with runny nose and cough.  Needs refill on her albuterol inhaler.  Moved 2 months ago and has not followed up with PCM.  PMHx seizures and depression  Family Hx heart disease, hypertension, diabetes and depression  Unable to talk in complete sentences due to cough and wheeze audible without stethescope.  Last vomiting last night after coughing.  The history is provided by the patient and a friend.    Past Medical History  Diagnosis Date  . Asthma   . Ear pain   . Restless legs   . Seizures [facility])    Past Surgical History  Procedure Laterality Date  . Abdominal hysterectomy    . Nasal sinus surgery    . Inner ear surgery Right    History reviewed. No pertinent family history. Social History  Substance Use Topics  . Smoking status: Never Smoker   . Smokeless tobacco: None  . Alcohol Use: No   OB History    No data available     Review of Systems  Constitutional: Positive for fever, chills and fatigue. Negative for diaphoresis, activity change, appetite change and unexpected weight change.  HENT: Positive for congestion, postnasal drip, rhinorrhea, sinus pressure and sneezing. Negative for dental problem, drooling, ear discharge, ear pain, facial swelling, hearing loss, mouth sores, nosebleeds, sore throat, tinnitus, trouble swallowing and voice change.   Eyes: Negative for photophobia, pain, discharge, redness, itching and visual disturbance.  Respiratory: Positive for cough, shortness of breath and wheezing. Negative for choking, chest tightness and stridor.   Cardiovascular:  Negative for chest pain, palpitations and leg swelling.  Gastrointestinal: Positive for nausea and vomiting. Negative for abdominal pain, diarrhea, constipation, blood in stool, abdominal distention and rectal pain.  Endocrine: Negative for cold intolerance and heat intolerance.  Genitourinary: Negative for dysuria.  Musculoskeletal: Positive for myalgias. Negative for back pain, joint swelling, arthralgias, gait problem, neck pain and neck stiffness.  Allergic/Immunologic: Positive for environmental allergies. Negative for food allergies.  Neurological: Positive for weakness. Negative for dizziness, tremors, seizures, syncope, facial asymmetry, speech difficulty, light-headedness, numbness and headaches.  Hematological: Negative for adenopathy. Does not bruise/bleed easily.  Psychiatric/Behavioral: Positive for sleep disturbance. Negative for behavioral problems, confusion and agitation.    Allergies  Review of patient's allergies indicates no known allergies.  Home Medications   Prior to Admission medications   Medication Sig Start Date End Date Taking? Authorizing Provider  ALBUTEROL IN Inhale into the lungs.   Yes Historical Provider, MD  PRAMIPEXOLE DIHYDROCHLORIDE PO Take by mouth.   Yes Historical Provider, MD  topiramate (TOPAMAX) 100 MG tablet Take 100 mg by mouth 2 (two) times daily.   Yes Historical Provider, MD  albuterol (PROVENTIL HFA;VENTOLIN HFA) 108 (90 BASE) MCG/ACT inhaler Inhale 1-2 puffs into the lungs every 6 (six) hours as needed for wheezing or shortness of breath. 08/14/15   Olen Cordial, NP  amoxicillin-clavulanate (AUGMENTIN) 875-125 MG tablet Take 1 tablet by mouth every 12 (twelve) hours. 08/14/15   Olen Cordial, NP  benzonatate (TESSALON) 200 MG capsule Take 1 capsule (200 mg total) by mouth 3 (three) times daily as needed for cough. 08/14/15  Olen Cordial, NP  ondansetron (ZOFRAN ODT) 8 MG disintegrating tablet Take 1 tablet (8 mg total) by mouth 2  (two) times daily as needed for nausea or vomiting (may substitute with generic formulation). 08/14/15   Olen Cordial, NP  predniSONE (DELTASONE) 50 MG tablet Take 1 tab po daily x 5 days 08/14/15 08/18/15  Olen Cordial, NP  sodium chloride (OCEAN) 0.65 % SOLN nasal spray Place 2 sprays into both nostrils every 2 (two) hours while awake. 08/14/15   Olen Cordial, NP   Meds Ordered and Administered this Visit   Medications  ipratropium-albuterol (DUONEB) 0.5-2.5 (3) MG/3ML nebulizer solution 3 mL (3 mLs Nebulization Given 08/14/15 1125)  ondansetron (ZOFRAN-ODT) disintegrating tablet 8 mg (8 mg Oral Given 08/14/15 1207)    BP 125/75 mmHg  Pulse 93  Temp(Src) 98 F (36.7 C) (Oral)  Resp 20  Ht 5' (1.524 m)  Wt 160 lb (72.576 kg)  BMI 31.25 kg/m2  SpO2 100% No data found.   Physical Exam  Constitutional: She is oriented to person, place, and time. Vital signs are normal. She appears well-developed and well-nourished. No distress.  HENT:  Head: Normocephalic and atraumatic.  Right Ear: Hearing, external ear and ear canal normal. A middle ear effusion is present.  Left Ear: Hearing, external ear and ear canal normal. A middle ear effusion is present.  Nose: Mucosal edema and rhinorrhea present. No nose lacerations, sinus tenderness, nasal deformity, septal deviation or nasal septal hematoma. No epistaxis.  No foreign bodies. Right sinus exhibits maxillary sinus tenderness. Right sinus exhibits no frontal sinus tenderness. Left sinus exhibits maxillary sinus tenderness. Left sinus exhibits no frontal sinus tenderness.  Mouth/Throat: Uvula is midline and mucous membranes are normal. Mucous membranes are not pale, not dry and not cyanotic. She does not have dentures. No oral lesions. No trismus in the jaw. Normal dentition. No dental abscesses, uvula swelling, lacerations or dental caries. Posterior oropharyngeal edema and posterior oropharyngeal erythema present. No oropharyngeal  exudate or tonsillar abscesses.  Cobblestoning posterior pharynx; exquisite tenderness bilateral maxillary sinuses; bilateral TMs with air fluid level slight opacity and vasculature inflamed; bilateral nasal turbinates with edema/erythema clear discharge  Eyes: Conjunctivae, EOM and lids are normal. Pupils are equal, round, and reactive to light. Right eye exhibits no chemosis, no discharge, no exudate and no hordeolum. No foreign body present in the right eye. Left eye exhibits no chemosis, no discharge, no exudate and no hordeolum. No foreign body present in the left eye. Right conjunctiva is not injected. Right conjunctiva has no hemorrhage. Left conjunctiva is not injected. Left conjunctiva has no hemorrhage. No scleral icterus. Right eye exhibits normal extraocular motion and no nystagmus. Left eye exhibits normal extraocular motion and no nystagmus. Right pupil is round and reactive. Left pupil is round and reactive. Pupils are equal.  Neck: Trachea normal and normal range of motion. Neck supple. No tracheal tenderness, no spinous process tenderness and no muscular tenderness present. No rigidity. No tracheal deviation, no edema, no erythema and normal range of motion present. No thyroid mass and no thyromegaly present.  Cardiovascular: Normal rate, regular rhythm, S1 normal, S2 normal, normal heart sounds and intact distal pulses.  PMI is not displaced.  Exam reveals no gallop and no friction rub.   No murmur heard. Pulmonary/Chest: Effort normal. No accessory muscle usage or stridor. No respiratory distress. She has decreased breath sounds in the right lower field and the left lower field. She has wheezes in the right  upper field, the right middle field, the left upper field and the left middle field. She has no rhonchi. She has no rales. She exhibits no tenderness.  Expiratory wheeze audible without stethescope; nonproductive cough with deep breath or speaking unable to complete sentences; decreased  airflow bases of lungs; negative egophany all fields; post duoneb wheezing resolved and improved airflow to lung bases  Abdominal: Soft. Bowel sounds are normal. She exhibits no shifting dullness, no distension, no pulsatile liver, no fluid wave, no abdominal bruit, no ascites, no pulsatile midline mass and no mass. There is no hepatosplenomegaly. There is no tenderness. There is no rigidity, no rebound, no guarding, no tenderness at McBurney's point and negative Murphy's sign. Hernia confirmed negative in the ventral area.  Musculoskeletal: Normal range of motion. She exhibits no edema or tenderness.       Right shoulder: Normal.       Left shoulder: Normal.       Right elbow: Normal.      Left elbow: Normal.       Right hip: Normal.       Left hip: Normal.       Right knee: Normal.       Left knee: Normal.       Right ankle: Normal.       Left ankle: Normal.       Cervical back: Normal.       Right hand: Normal.       Left hand: Normal.  Lymphadenopathy:    She has no cervical adenopathy.  Neurological: She is alert and oriented to person, place, and time. She displays no atrophy and no tremor. No cranial nerve deficit or sensory deficit. She exhibits normal muscle tone. She displays no seizure activity. Coordination and gait normal. GCS eye subscore is 4. GCS verbal subscore is 5. GCS motor subscore is 6.  Skin: Skin is warm, dry and intact. No abrasion, no bruising, no burn, no ecchymosis, no laceration, no lesion, no petechiae and no rash noted. She is not diaphoretic. No cyanosis or erythema. No pallor. Nails show no clubbing.  Psychiatric: She has a normal mood and affect. Her speech is normal and behavior is normal. Judgment and thought content normal. Cognition and memory are normal.  Nursing note and vitals reviewed.   ED Course  Procedures (including critical care time)  Labs Review Labs Reviewed - No data to display  Imaging Review Dg Chest 2 View  08/14/2015   CLINICAL  DATA:  Cough, 3 days duration.  EXAM: CHEST  2 VIEW  COMPARISON:  02/12/2014  FINDINGS: Vagal nerve stimulator on the left appears the same. Heart size is normal. Mediastinal shadows are normal. The lungs are clear. No infiltrate, effusion or collapse. No acute bony finding.  IMPRESSION: No active cardiopulmonary disease.   Electronically Signed   By: Nelson Chimes M.D.   On: 08/14/2015 11:53    1135 Post duoneb patient able to talk in complete sentences.  Administered by RN Lolita Cram.  Still has cough with deep breaths airflow improved to lung bases and wheezing resolved all fields.  Filed Vitals:   08/14/15 1231  BP:   Pulse: 93  Temp:   Resp:   sp02 99% RA  RR20  1210 Discussed chest xray results with patient and friend.  Given copy of radiology report.  Negative for pneumonia/fluid in lungs.  Patient and friend Jacqlyn Larsen verbalized understanding of information/instructions, agreed with plan of care and had no further questions at  this time.  1220 patient reported relief from zofran for nausea 8mg  ODT po x1 by RN Lolita Cram at 1207.   MDM   1. Acute bronchitis, unspecified organism   2. Otitis media with effusion, bilateral   3. Acute maxillary sinusitis, recurrence not specified   4. Acute gastroenteritis    Patient reported history of asthma.  Prednisone 50mg  po daily x 5 days Rx given.  Refilled albulterol inhaler and discussed MDI usage demonstrated technique. Tessalon pearles 200mg  po TID prn cough.  Bronchitis simple, community acquired, may have started as viral (probably respiratory syncytial, parainfluenza, influenza, or adenovirus), but now evidence of acute purulent bronchitis with resultant bronchial edema and mucus formation.  Viruses are the most common cause of bronchial inflammation in otherwise healthy adults with acute bronchitis.  The appearance of sputum is not predictive of whether a bacterial infection is present.  Purulent sputum is most often caused by viral  infections.  There are a small portion of those caused by non-viral agents being Mycoplamsa pneumonia.  Microscopic examination or C&S of sputum in the healthy adult with acute bronchitis is generally not helpful (usually negative or normal respiratory flora) other considerations being cough from upper respiratory tract infections, sinusitis or allergic syndromes (mild asthma or viral pneumonia).  Differential Diagnosis:  reactive airway disease (asthma, allergic aspergillosis (eosinophilia), chronic bronchitis, respiratory infection (Sinusitis, Common cold, pneumonia), congestive heart failure, reflux esophagitis, bronchogenic tumor, aspiration syndromes and/or exposure irritants/tobacco smoke.  In this case, there is no evidence of any invasive bacterial illness.  Most likely viral etiology so will hold on antibiotic treatment.  Advise supportive care with rest, encourage fluids, good hygiene and watch for any worsening symptoms.  If they were to develop:  come back to the office or go to the emergency room if after hours. Without high fever, severe dyspnea, lack of physical findings or other risk factors, I will hold on a chest radiograph and CBC at this time. I discussed that approximately 50% of patients with acute bronchitis have a cough that lasts up to three weeks, and 25% for over a month.  Tylenol, one to two tablets every four hours as needed for fever or myalgias.   No aspirin.  Patient instructed to follow up in one week or sooner if symptoms worsen. Patient verbalized agreement and understanding of treatment plan.  P2:  hand washing and cover cough  Supportive treatment. flonase 1 spray each nostril bid and nasal saline 2 sprays each nostril q2h while awake prn congestion.  Rx Augmentin 875mg  po BID x 10 days if no relief with flonase and nasal saline.  No evidence of bacterial infection, non toxic and well hydrated.  I do not see where any further testing or imaging is necessary at this time.   I  will suggest supportive care, rest, good hygiene and encourage the patient to take adequate fluids.  The patient is to return to clinic or EMERGENCY ROOM if symptoms worsen or change significantly.  Exitcare handout on sinusitis given to patient.  Patient verbalized agreement and understanding of treatment plan and had no further questions at this time.   P2:  Hand washing and cover cough   No evidence of invasive bacterial infection, non toxic and well hydrated.  This is most likely self limiting viral infection.  I do not see where any further testing or imaging is necessary at this time.   I will suggest supportive care, rest, good hygiene and encourage the patient to take  adequate fluids.  The patient is to return to clinic or EMERGENCY ROOM if symptoms worsen or change significantly e.g. ear pain, fever, purulent discharge from ears or bleeding.  Exitcare handout on otitis media with effusion given to patient.  Patient verbalized agreement and understanding of treatment plan.    I have recommended clear fluids and the BRAT diet.  Medications as directed. zofran 8mg  ODT po BID prn nausea/vomiting.  Next dose in 12 hours as first dose administered in clinic with good relief.  Return to the clinic if  symptoms persist or worsen; I have alerted the patient to call if high fever, unable to urinate every 8 hours, dehydration, marked weakness, fainting, increased abdominal pain, blood in stool or vomit.  Patient verbalized agreement and understanding of treatment plan.   P2:  Hand washing and fitness    Olen Cordial, NP 08/16/15 [phone]

## 2015-12-15 ENCOUNTER — Ambulatory Visit: Payer: Medicare Other | Attending: Family Medicine

## 2016-01-29 ENCOUNTER — Ambulatory Visit
Admission: EM | Admit: 2016-01-29 | Discharge: 2016-01-29 | Disposition: A | Discharge status: Home / Self Care | Payer: Medicare Other | Attending: Family Medicine | Admitting: Family Medicine

## 2016-01-29 ENCOUNTER — Encounter: Payer: Self-pay | Admitting: *Deleted

## 2016-01-29 DIAGNOSIS — S39012A Strain of muscle, fascia and tendon of lower back, initial encounter: Secondary | ICD-10-CM | POA: Insufficient documentation

## 2016-01-29 DIAGNOSIS — X58XXXA Exposure to other specified factors, initial encounter: Secondary | ICD-10-CM | POA: Insufficient documentation

## 2016-01-29 DIAGNOSIS — N390 Urinary tract infection, site not specified: Secondary | ICD-10-CM | POA: Diagnosis not present

## 2016-01-29 DIAGNOSIS — G2581 Restless legs syndrome: Secondary | ICD-10-CM | POA: Insufficient documentation

## 2016-01-29 DIAGNOSIS — J45909 Unspecified asthma, uncomplicated: Secondary | ICD-10-CM | POA: Diagnosis not present

## 2016-01-29 DIAGNOSIS — R300 Dysuria: Secondary | ICD-10-CM | POA: Diagnosis present

## 2016-01-29 DIAGNOSIS — M549 Dorsalgia, unspecified: Secondary | ICD-10-CM | POA: Diagnosis present

## 2016-01-29 DIAGNOSIS — Z79899 Other long term (current) drug therapy: Secondary | ICD-10-CM | POA: Insufficient documentation

## 2016-01-29 LAB — URINALYSIS COMPLETE WITH MICROSCOPIC (ARMC ONLY)
Bilirubin Urine: NEGATIVE
Glucose, UA: NEGATIVE mg/dL
Hgb urine dipstick: NEGATIVE
Ketones, ur: NEGATIVE mg/dL
NITRITE: NEGATIVE
PH: 7 (ref 5.0–8.0)
PROTEIN: NEGATIVE mg/dL
SPECIFIC GRAVITY, URINE: 1.02 (ref 1.005–1.030)

## 2016-01-29 MED ORDER — SULFAMETHOXAZOLE-TRIMETHOPRIM 800-160 MG PO TABS: 1.0000 | ORAL_TABLET | Freq: Two times a day (BID) | ORAL | Status: DC

## 2016-01-29 MED ORDER — PHENAZOPYRIDINE HCL 200 MG PO TABS: 200.0000 mg | ORAL_TABLET | Freq: Three times a day (TID) | ORAL | Status: DC

## 2016-01-29 MED ORDER — CYCLOBENZAPRINE HCL 10 MG PO TABS: 10.0000 mg | ORAL_TABLET | Freq: Three times a day (TID) | ORAL | Status: DC | PRN

## 2016-01-29 MED ORDER — NAPROXEN 375 MG PO TABS
375.0000 mg | ORAL_TABLET | Freq: Two times a day (BID) | ORAL | Status: DC
Start: 2016-01-29 — End: 2017-04-15

## 2016-01-29 NOTE — ED Notes (Signed)
Dysuria, bilat flank pain, and nausea x3 days.

## 2016-01-29 NOTE — Discharge Instructions (Signed)
Back Injury Prevention Back injuries can be very painful. They can also be difficult to heal. After having one back injury, you are more likely to injure your back again. It is important to learn how to avoid injuring or re-injuring your back. The following tips can help you to prevent a back injury. WHAT SHOULD I KNOW ABOUT PHYSICAL FITNESS?  Exercise for 30 minutes per day on most days of the week or as told by your doctor. Make sure to:  Do aerobic exercises, such as walking, jogging, biking, or swimming.  Do exercises that increase balance and strength, such as tai chi and yoga.  Do stretching exercises. This helps with flexibility.  Try to develop strong belly (abdominal) muscles. Your belly muscles help to support your back.  Stay at a healthy weight. This helps to decrease your risk of a back injury. WHAT SHOULD I KNOW ABOUT MY DIET?  Talk with your doctor about your overall diet. Take supplements and vitamins only as told by your doctor.  Talk with your doctor about how much calcium and vitamin D you need each day. These nutrients help to prevent weakening of the bones (osteoporosis).  Include good sources of calcium in your diet, such as:  Dairy products.  Green leafy vegetables.  Products that have had calcium added to them (fortified).  Include good sources of vitamin D in your diet, such as:  Milk.  Foods that have had vitamin D added to them. WHAT SHOULD I KNOW ABOUT MY POSTURE?  Sit up straight and stand up straight. Avoid leaning forward when you sit or hunching over when you stand.  Choose chairs that have good low-back (lumbar) support.  If you work at a desk, sit close to it so you do not need to lean over. Keep your chin tucked in. Keep your neck drawn back. Keep your elbows bent so your arms look like the letter "L" (right angle).  Sit high and close to the steering wheel when you drive. Add a low-back support to your car seat, if needed.  Avoid sitting  or standing in one position for very long. Take breaks to get up, stretch, and walk around at least one time every hour. Take breaks every hour if you are driving for long periods of time.  Sleep on your side with your knees slightly bent, or sleep on your back with a pillow under your knees. Do not lie on the front of your body to sleep. WHAT SHOULD I KNOW ABOUT LIFTING, TWISTING, AND REACHING Lifting and Heavy Lifting  Avoid heavy lifting, especially lifting over and over again. If you must do heavy lifting:  Stretch before lifting.  Work slowly.  Rest between lifts.  Use a tool such as a cart or a dolly to move objects if one is available.  Make several small trips instead of carrying one heavy load.  Ask for help when you need it, especially when moving big objects.  Follow these steps when lifting:  Stand with your feet shoulder-width apart.  Get as close to the object as you can. Do not pick up a heavy object that is far from your body.  Use handles or lifting straps if they are available.  Bend at your knees. Squat down, but keep your heels off the floor.  Keep your shoulders back. Keep your chin tucked in. Keep your back straight.  Lift the object slowly while you tighten the muscles in your legs, belly, and butt. Keep the object  as close to the center of your body as possible.  Follow these steps when putting down a heavy load:  Stand with your feet shoulder-width apart.  Lower the object slowly while you tighten the muscles in your legs, belly, and butt. Keep the object as close to the center of your body as possible.  Keep your shoulders back. Keep your chin tucked in. Keep your back straight.  Bend at your knees. Squat down, but keep your heels off the floor.  Use handles or lifting straps if they are available. Twisting and Reaching  Avoid lifting heavy objects above your waist.  Do not twist at your waist while you are lifting or carrying a load. If  you need to turn, move your feet.  Do not bend over without bending at your knees.  Avoid reaching over your head, across a table, or for an object on a high surface.  WHAT ARE SOME OTHER TIPS?  Avoid wet floors and icy ground. Keep sidewalks clear of ice to prevent falls.   Do not sleep on a mattress that is too soft or too hard.   Keep items that you use often within easy reach.   Put heavier objects on shelves at waist level, and put lighter objects on lower or higher shelves.  Find ways to lower your stress, such as:  Exercise.  Massage.  Relaxation techniques.  Talk with your doctor if you feel anxious or depressed. These conditions can make back pain worse.  Wear flat heel shoes with cushioned soles.  Avoid making quick (sudden) movements.  Use both shoulder straps when carrying a backpack.  Do not use any tobacco products, including cigarettes, chewing tobacco, or electronic cigarettes. If you need help quitting, ask your doctor.   This information is not intended to replace advice given to you by your health care provider. Make sure you discuss any questions you have with your health care provider.   Document Released: 04/10/2008 Document Revised: 03/09/2015 Document Reviewed: 10/27/2014 Elsevier Interactive Patient Education 2016 [employer]  Dysuria Dysuria is pain or discomfort while urinating. The pain or discomfort may be felt in the tube that carries urine out of the bladder (urethra) or in the surrounding tissue of the genitals. The pain may also be felt in the groin area, lower abdomen, and lower back. You may have to urinate frequently or have the sudden feeling that you have to urinate (urgency). Dysuria can affect both men and women, but is more common in women. Dysuria can be caused by many different things, including:  Urinary tract infection in women.  Infection of the kidney or bladder.  Kidney stones or bladder stones.  Certain sexually  transmitted infections (STIs), such as chlamydia.  Dehydration.  Inflammation of the vagina.  Use of certain medicines.  Use of certain soaps or scented products that cause irritation. HOME CARE INSTRUCTIONS Watch your dysuria for any changes. The following actions may help to reduce any discomfort you are feeling:  Drink enough fluid to keep your urine clear or pale yellow.  Empty your bladder often. Avoid holding urine for long periods of time.  After a bowel movement or urination, women should cleanse from front to back, using each tissue only once.  Empty your bladder after sexual intercourse.  Take medicines only as directed by your health care provider.  If you were prescribed an antibiotic medicine, finish it all even if you start to feel better.  Avoid caffeine, tea, and alcohol. They can  irritate the bladder and make dysuria worse. In men, alcohol may irritate the prostate.  Keep all follow-up visits as directed by your health care provider. This is important.  If you had any tests done to find the cause of dysuria, it is your responsibility to obtain your test results. Ask the lab or department performing the test when and how you will get your results. Talk with your health care provider if you have any questions about your results. SEEK MEDICAL CARE IF:  You develop pain in your back or sides.  You have a fever.  You have nausea or vomiting.  You have blood in your urine.  You are not urinating as often as you usually do. SEEK IMMEDIATE MEDICAL CARE IF:  You pain is severe and not relieved with medicines.  You are unable to hold down any fluids.  You or someone else notices a change in your mental function.  You have a rapid heartbeat at rest.  You have shaking or chills.  You feel extremely weak.   This information is not intended to replace advice given to you by your health care provider. Make sure you discuss any questions you have with your  health care provider.   Document Released: 07/21/2004 Document Revised: 11/13/2014 Document Reviewed: 06/18/2014 Elsevier Interactive Patient Education [employer].

## 2016-01-29 NOTE — ED Provider Notes (Signed)
CSN: [id number]     Arrival date & time 01/29/16  1117 History   First MD Initiated Contact with Patient 01/29/16 1227     Chief Complaint  Patient presents with  . Back Pain  . Dysuria  . Nausea   (Consider location/radiation/quality/duration/timing/severity/associated sxs/prior Treatment) HPI  60 year old female who presents with a 2 problems. Is back pain. She states that 3 days ago she was moving a lot of furniture. Her husband has died she has been performed to function at home by herself. Since that time she's had essentially nonradiating  back pain indicating the upper lumbar area. She states that any motion seems to make the pain worse.  Second, is that of dysuria. She states that that she just finished a course of antibiotics last week which she was taking she thinks Cipro as prescribed by her primary care doctor. DeSpite that she still has her dysuria. He also has noted a greenish yellow vaginal discharge. Was not addressed by her primary care physician. Sates that she has had bacterial vaginosis in the past and this is similar. She's also had nausea for 3 days. She has not had any fever or chills and is afebrile today.     Past Medical History  Diagnosis Date  . Asthma   . Ear pain   . Restless legs   . Seizures [facility])    Past Surgical History  Procedure Laterality Date  . Abdominal hysterectomy    . Nasal sinus surgery    . Inner ear surgery Right    History reviewed. No pertinent family history. Social History  Substance Use Topics  . Smoking status: Never Smoker   . Smokeless tobacco: None  . Alcohol Use: No   OB History    No data available     Review of Systems  Constitutional: Positive for activity change. Negative for fever, chills and fatigue.  Genitourinary: Positive for dysuria, frequency and vaginal discharge.  All other systems reviewed and are negative.   Allergies  Review of patient's allergies indicates no known allergies.  Home Medications    Prior to Admission medications   Medication Sig Start Date End Date Taking? Authorizing Provider  albuterol (PROVENTIL HFA;VENTOLIN HFA) 108 (90 BASE) MCG/ACT inhaler Inhale 1-2 puffs into the lungs every 6 (six) hours as needed for wheezing or shortness of breath. 08/14/15  Yes Olen Cordial, NP  ALBUTEROL IN Inhale into the lungs.   Yes Historical Provider, MD  lubiprostone (AMITIZA) 8 MCG capsule Take 8 mcg by mouth 2 (two) times daily with a meal.   Yes Historical Provider, MD  PRAMIPEXOLE DIHYDROCHLORIDE PO Take by mouth.   Yes Historical Provider, MD  topiramate (TOPAMAX) 100 MG tablet Take 100 mg by mouth 2 (two) times daily.   Yes Historical Provider, MD  amoxicillin-clavulanate (AUGMENTIN) 875-125 MG tablet Take 1 tablet by mouth every 12 (twelve) hours. 08/14/15   Olen Cordial, NP  benzonatate (TESSALON) 200 MG capsule Take 1 capsule (200 mg total) by mouth 3 (three) times daily as needed for cough. 08/14/15   Olen Cordial, NP  cyclobenzaprine (FLEXERIL) 10 MG tablet Take 1 tablet (10 mg total) by mouth 3 (three) times daily as needed for muscle spasms. 01/29/16   Lorin Picket, PA-C  naproxen (NAPROSYN) 375 MG tablet Take 1 tablet (375 mg total) by mouth 2 (two) times daily with a meal. 01/29/16   Lorin Picket, PA-C  ondansetron (ZOFRAN ODT) 8 MG disintegrating tablet Take 1 tablet (  8 mg total) by mouth 2 (two) times daily as needed for nausea or vomiting (may substitute with generic formulation). 08/14/15   Olen Cordial, NP  phenazopyridine (PYRIDIUM) 200 MG tablet Take 1 tablet (200 mg total) by mouth 3 (three) times daily. 01/29/16   Lorin Picket, PA-C  sodium chloride (OCEAN) 0.65 % SOLN nasal spray Place 2 sprays into both nostrils every 2 (two) hours while awake. 08/14/15   Olen Cordial, NP  sulfamethoxazole-trimethoprim (BACTRIM DS,SEPTRA DS) 800-160 MG tablet Take 1 tablet by mouth 2 (two) times daily. 01/29/16   Lorin Picket, PA-C   Meds Ordered  and Administered this Visit  Medications - No data to display  BP 128/75 mmHg  Pulse 73  Temp(Src) 97.8 F (36.6 C) (Oral)  Resp 16  Ht 5\' 1"  (1.549 m)  Wt 150 lb (68.04 kg)  BMI 28.36 kg/m2  SpO2 100% No data found.   Physical Exam  Constitutional: She is oriented to person, place, and time. She appears well-developed and well-nourished. No distress.  HENT:  Head: Normocephalic and atraumatic.  Eyes: Conjunctivae are normal. Pupils are equal, round, and reactive to light.  Neck: Normal range of motion. Neck supple.  Abdominal:  She has no CVA tenderness  Musculoskeletal: She exhibits tenderness.  Exam is was performed in the presence of Camille, EMA acting as chaperone and assisted. She has a level pelvis in stance. She is reluctant to forward flex fully lateral flexion is blunted bilaterally with discomfort in the thoracal lumbar area. Is able to toe and heel walk adequately. DTRs are 2+ over 4 and symmetrical in the ankle and knee. There is no clonus present. EHL peroneal and anterior talus are strong to clinical testing. And sedation is intact distally. Her leg raise testing is negative in the sitting position to 90 bilaterally there is tenderness in the upper lumbar region standing from approximately T12 L1-L3 bilaterally.  Neurological: She is alert and oriented to person, place, and time. She displays normal reflexes. She exhibits normal muscle tone. Coordination normal.  Skin: Skin is warm and dry. She is not diaphoretic.  Psychiatric: She has a normal mood and affect. Her behavior is normal. Judgment and thought content normal.  Nursing note and vitals reviewed.   ED Course  Procedures (including critical care time)  Labs Review Labs Reviewed  URINALYSIS COMPLETEWITH MICROSCOPIC ([facility] ONLY) - Abnormal; Notable for the following:    APPearance CLOUDY (*)    Leukocytes, UA 2+ (*)    Bacteria, UA MANY (*)    Squamous Epithelial / LPF 0-5 (*)    All other components  within normal limits  URINE CULTURE    Imaging Review No results found.   Visual Acuity Review  Right Eye Distance:   Left Eye Distance:   Bilateral Distance:    Right Eye Near:   Left Eye Near:    Bilateral Near:         MDM   1. Lumbar strain, initial encounter   2. UTI (lower urinary tract infection)    Discharge Medication List as of 01/29/2016  1:14 PM    START taking these medications   Details  cyclobenzaprine (FLEXERIL) 10 MG tablet Take 1 tablet (10 mg total) by mouth 3 (three) times daily as needed for muscle spasms., Starting 01/29/2016, Until Discontinued, Normal    naproxen (NAPROSYN) 375 MG tablet Take 1 tablet (375 mg total) by mouth 2 (two) times daily with a meal., Starting 01/29/2016, Until Discontinued,  Normal    phenazopyridine (PYRIDIUM) 200 MG tablet Take 1 tablet (200 mg total) by mouth 3 (three) times daily., Starting 01/29/2016, Until Discontinued, Normal    sulfamethoxazole-trimethoprim (BACTRIM DS,SEPTRA DS) 800-160 MG tablet Take 1 tablet by mouth 2 (two) times daily., Starting 01/29/2016, Until Discontinued, Normal      Plan: 1. Test/x-ray results and diagnosis reviewed with patient 2. rx as per orders; risks, benefits, potential side effects reviewed with patient 3. Recommend supportive treatment with Symptom avoidance for her low back. I will provide her with some anti-inflammatory medications as well as muscle relaxants to assist her. Her dysuria is more complicated.SHe is already undergone a course of antibiotic therapy with Cipro but despite this continues to complain of the dysuria in addition to vaginal discharge that she has. I told her that she either has a urinary tract infection that was not treated with the Cipro probably from a resistant bacteria or more likely she may have a vaginosis causing her dysuria. She refuses a pelvic examination today and would prefer to treat the suspected UTI with a different antibiotic and if that is  unsuccessful then she would go to her primary care Dr. Clemmie Krill for further evaluation and a possible pelvic exam. I will culture her urine today and will notify her in 24 hours of bacteria and also sensitive to the Septra. I've also given her prescription for prior radium for the dysuria in case this is a UTI. This was fully explained to her in the presence of the chaperone. 4. F/u prn if symptoms worsen or don't improve      Lorin Picket, PA-C 01/29/16 1332

## 2016-01-31 LAB — URINE CULTURE: Special Requests: NORMAL

## 2017-01-24 ENCOUNTER — Ambulatory Visit
Admission: EM | Admit: 2017-01-24 | Discharge: 2017-01-24 | Disposition: A | Discharge status: Home / Self Care | Payer: Medicare Other | Attending: Family Medicine | Admitting: Family Medicine

## 2017-01-24 ENCOUNTER — Encounter: Payer: Self-pay | Admitting: *Deleted

## 2017-01-24 ENCOUNTER — Ambulatory Visit: Payer: Medicare Other

## 2017-01-24 DIAGNOSIS — J4521 Mild intermittent asthma with (acute) exacerbation: Secondary | ICD-10-CM | POA: Diagnosis present

## 2017-01-24 DIAGNOSIS — R062 Wheezing: Secondary | ICD-10-CM

## 2017-01-24 DIAGNOSIS — R05 Cough: Secondary | ICD-10-CM

## 2017-01-24 DIAGNOSIS — R0981 Nasal congestion: Secondary | ICD-10-CM | POA: Insufficient documentation

## 2017-01-24 DIAGNOSIS — Z90710 Acquired absence of both cervix and uterus: Secondary | ICD-10-CM | POA: Insufficient documentation

## 2017-01-24 DIAGNOSIS — Z9889 Other specified postprocedural states: Secondary | ICD-10-CM | POA: Insufficient documentation

## 2017-01-24 DIAGNOSIS — Z79899 Other long term (current) drug therapy: Secondary | ICD-10-CM | POA: Insufficient documentation

## 2017-01-24 DIAGNOSIS — G2581 Restless legs syndrome: Secondary | ICD-10-CM | POA: Diagnosis not present

## 2017-01-24 DIAGNOSIS — R509 Fever, unspecified: Secondary | ICD-10-CM | POA: Diagnosis not present

## 2017-01-24 MED ORDER — AZITHROMYCIN 250 MG PO TABS: ORAL_TABLET | ORAL | 0 refills | Status: DC

## 2017-01-24 MED ORDER — PREDNISONE 20 MG PO TABS: ORAL_TABLET | ORAL | 0 refills | Status: DC

## 2017-01-24 MED ORDER — IPRATROPIUM-ALBUTEROL 0.5-2.5 (3) MG/3ML IN SOLN
3.0000 mL | Freq: Once | RESPIRATORY_TRACT | Status: AC
Start: 2017-01-24 — End: 2017-01-24
  Administered 2017-01-24: 3 mL via RESPIRATORY_TRACT

## 2017-01-24 MED ORDER — IPRATROPIUM-ALBUTEROL 0.5-2.5 (3) MG/3ML IN SOLN
3.0000 mL | Freq: Once | RESPIRATORY_TRACT | Status: AC
  Administered 2017-01-24: 3 mL via RESPIRATORY_TRACT

## 2017-01-24 MED ORDER — METHYLPREDNISOLONE SODIUM SUCC 125 MG IJ SOLR
125.0000 mg | Freq: Once | INTRAMUSCULAR | Status: AC
  Administered 2017-01-24: 125 mg via INTRAMUSCULAR

## 2017-01-24 NOTE — ED Triage Notes (Signed)
Productive cough- green, sore throat, head congestion, chills, body aches, x9 days.

## 2017-01-24 NOTE — ED Triage Notes (Signed)
Audible wheeze and dyspnea. Has been using albuterol neb tx at homes with less .

## 2017-01-24 NOTE — ED Provider Notes (Signed)
[facility]    CSN: [id number] Arrival date & time: 01/24/17  1617     History   Chief Complaint Chief Complaint  Patient presents with  . Cough  . Fever  . Nasal Congestion    HPI Cynthia Fisher is a 61 y.o. female.   The history is provided by the patient.  Cough  Associated symptoms: fever, headaches and wheezing   Fever  Associated symptoms: cough and headaches   URI  Presenting symptoms: cough and fever   Severity:  Moderate Onset quality:  Sudden Duration:  5 days Timing:  Constant Progression:  Worsening Chronicity:  New Relieved by:  Nothing Ineffective treatments:  Nebulizer treatments and OTC medications Associated symptoms: headaches and wheezing   Risk factors: chronic respiratory disease (asthma) and sick contacts   Risk factors: not elderly, no chronic cardiac disease, no chronic kidney disease, no diabetes mellitus, no immunosuppression, no recent illness and no recent travel     Past Medical History:  Diagnosis Date  . Asthma   . Ear pain   . Restless legs   . Seizures ([place])     There are no active problems to display for this patient.   Past Surgical History:  Procedure Laterality Date  . ABDOMINAL HYSTERECTOMY    . INNER EAR SURGERY Right   . NASAL SINUS SURGERY      OB History    No data available       Home Medications    Prior to Admission medications   Medication Sig Start Date End Date Taking? Authorizing Provider  albuterol (PROVENTIL HFA;VENTOLIN HFA) 108 (90 BASE) MCG/ACT inhaler Inhale 1-2 puffs into the lungs every 6 (six) hours as needed for wheezing or shortness of breath. 08/14/15  Yes Olen Cordial, NP  ALBUTEROL IN Inhale into the lungs.   Yes Historical Provider, MD  lubiprostone (AMITIZA) 8 MCG capsule Take 8 mcg by mouth 2 (two) times daily with a meal.   Yes Historical Provider, MD  PRAMIPEXOLE DIHYDROCHLORIDE PO Take by mouth.   Yes Historical Provider, MD  sodium chloride (OCEAN) 0.65 %  SOLN nasal spray Place 2 sprays into both nostrils every 2 (two) hours while awake. 08/14/15  Yes Olen Cordial, NP  topiramate (TOPAMAX) 100 MG tablet Take 100 mg by mouth 2 (two) times daily.   Yes Historical Provider, MD  amoxicillin-clavulanate (AUGMENTIN) 875-125 MG tablet Take 1 tablet by mouth every 12 (twelve) hours. 08/14/15   Olen Cordial, NP  azithromycin (ZITHROMAX Z-PAK) 250 MG tablet 2 tabs po once day 1, then 1 tab po qd for next 4 days 01/24/17   Norval Gable, MD  benzonatate (TESSALON) 200 MG capsule Take 1 capsule (200 mg total) by mouth 3 (three) times daily as needed for cough. 08/14/15   Olen Cordial, NP  cyclobenzaprine (FLEXERIL) 10 MG tablet Take 1 tablet (10 mg total) by mouth 3 (three) times daily as needed for muscle spasms. 01/29/16   Lorin Picket, PA-C  naproxen (NAPROSYN) 375 MG tablet Take 1 tablet (375 mg total) by mouth 2 (two) times daily with a meal. 01/29/16   Lorin Picket, PA-C  ondansetron (ZOFRAN ODT) 8 MG disintegrating tablet Take 1 tablet (8 mg total) by mouth 2 (two) times daily as needed for nausea or vomiting (may substitute with generic formulation). 08/14/15   Olen Cordial, NP  phenazopyridine (PYRIDIUM) 200 MG tablet Take 1 tablet (200 mg total) by mouth 3 (three) times daily.  01/29/16   Lorin Picket, PA-C  predniSONE (DELTASONE) 20 MG tablet 3 tabs po qd for 2 days, then 2 tabs po qd for 3 days, then 1 tab po qd for 3 days, then half a tab po qd for 2 days 01/24/17   Norval Gable, MD  sulfamethoxazole-trimethoprim (BACTRIM DS,SEPTRA DS) 800-160 MG tablet Take 1 tablet by mouth 2 (two) times daily. 01/29/16   Lorin Picket, PA-C    Family History History reviewed. No pertinent family history.  Social History Social History  Substance Use Topics  . Smoking status: Never Smoker  . Smokeless tobacco: Never Used  . Alcohol use No     Allergies   Patient has no known allergies.   Review of Systems Review of Systems    Constitutional: Positive for fever.  Respiratory: Positive for cough and wheezing.   Neurological: Positive for headaches.     Physical Exam Triage Vital Signs ED Triage Vitals  Enc Vitals Group     BP 01/24/17 1645 (!) 156/95     Pulse Rate 01/24/17 1643 (!) 115     Resp 01/24/17 1643 (!) 22     Temp 01/24/17 1643 97.6 F (36.4 C)     Temp Source 01/24/17 1643 Oral     SpO2 01/24/17 1645 100 %     Weight 01/24/17 1646 140 lb (63.5 kg)     Height 01/24/17 1646 5\' 6"  (1.676 m)     Head Circumference --      Peak Flow --      Pain Score --      Pain Loc --      Pain Edu? --      Excl. in [place]? --    No data found.   Updated Vital Signs BP (!) 156/95 (BP Location: Left Arm)   Pulse 81   Temp 97.6 F (36.4 C) (Oral)   Resp (!) 22   Ht 5\' 6"  (1.676 m)   Wt 140 lb (63.5 kg)   SpO2 100%   BMI 22.60 kg/m   Visual Acuity Right Eye Distance:   Left Eye Distance:   Bilateral Distance:    Right Eye Near:   Left Eye Near:    Bilateral Near:     Physical Exam  Constitutional: She appears well-developed and well-nourished. No distress.  HENT:  Head: Normocephalic and atraumatic.  Right Ear: Tympanic membrane, external ear and ear canal normal.  Left Ear: Tympanic membrane, external ear and ear canal normal.  Nose: Mucosal edema and rhinorrhea present. No nose lacerations, sinus tenderness, nasal deformity, septal deviation or nasal septal hematoma. No epistaxis.  No foreign bodies. Right sinus exhibits maxillary sinus tenderness and frontal sinus tenderness. Left sinus exhibits maxillary sinus tenderness and frontal sinus tenderness.  Mouth/Throat: Uvula is midline, oropharynx is clear and moist and mucous membranes are normal. No oropharyngeal exudate.  Eyes: Conjunctivae and EOM are normal. Pupils are equal, round, and reactive to light. Right eye exhibits no discharge. Left eye exhibits no discharge. No scleral icterus.  Neck: Normal range of motion. Neck supple. No  thyromegaly present.  Cardiovascular: Normal rate, regular rhythm and normal heart sounds.   Pulmonary/Chest: Effort normal. No respiratory distress. She has wheezes (inspiratory and expiratory diffuse, bilaterally). She has no rales.  Lymphadenopathy:    She has no cervical adenopathy.  Skin: She is not diaphoretic.  Nursing note and vitals reviewed.    UC Treatments / Results  Labs (all labs ordered are listed, but  only abnormal results are displayed) Labs Reviewed - No data to display  EKG  EKG Interpretation None       Radiology Dg Chest 2 View  Result Date: 01/24/2017 CLINICAL DATA:  Productive cough- green, sore throat, head congestion, chills, body aches, x9 days. Hx of vago nerve stimulator. EXAM: CHEST  2 VIEW COMPARISON:  Chest x-rays dated 08/14/2015 and 02/12/2014. FINDINGS: Heart size and mediastinal contours are normal. Lungs are clear. No pleural effusion or pneumothorax seen. There is stable levoscoliosis of the thoracolumbar spine. No acute or suspicious osseous finding. Stimulator hardware again overlies the left hemithorax. IMPRESSION: No active cardiopulmonary disease. No evidence of pneumonia or pulmonary edema. Electronically Signed   By: Franki Cabot M.D.   On: 01/24/2017 17:39    Procedures Procedures (including critical care time)  Medications Ordered in UC Medications  ipratropium-albuterol (DUONEB) 0.5-2.5 (3) MG/3ML nebulizer solution 3 mL (3 mLs Nebulization Given 01/24/17 1700)  ipratropium-albuterol (DUONEB) 0.5-2.5 (3) MG/3ML nebulizer solution 3 mL (3 mLs Nebulization Given 01/24/17 1744)  methylPREDNISolone sodium succinate (SOLU-MEDROL) 125 mg/2 mL injection 125 mg (125 mg Intramuscular Given 01/24/17 1757)     Initial Impression / Assessment and Plan / UC Course  I have reviewed the triage vital signs and the nursing notes.  Pertinent labs & imaging results that were available during my care of the patient were reviewed by me and considered  in my medical decision making (see chart for details).       Final Clinical Impressions(s) / UC Diagnoses   Final diagnoses:  Exacerbation of intermittent asthma, unspecified asthma severity    New Prescriptions Discharge Medication List as of 01/24/2017  6:19 PM    START taking these medications   Details  azithromycin (ZITHROMAX Z-PAK) 250 MG tablet 2 tabs po once day 1, then 1 tab po qd for next 4 days, Normal    predniSONE (DELTASONE) 20 MG tablet 3 tabs po qd for 2 days, then 2 tabs po qd for 3 days, then 1 tab po qd for 3 days, then half a tab po qd for 2 days, Normal       1. x-ray results and diagnosis reviewed with patient 2. Patient given duoneb x 2 and solumedrol 125mg  im x 1 with improvement of symptoms 3.  rx as per orders above; reviewed possible side effects, interactions, risks and benefits  4. Recommend supportive treatment with  5. Follow-up prn if symptoms worsen or don't improve   Norval Gable, MD 01/24/17 1825

## 2017-04-15 ENCOUNTER — Encounter: Payer: Self-pay | Admitting: Gynecology

## 2017-04-15 ENCOUNTER — Ambulatory Visit
Admission: EM | Admit: 2017-04-15 | Discharge: 2017-04-15 | Disposition: A | Discharge status: Home / Self Care | Payer: Medicare Other | Attending: Internal Medicine | Admitting: Internal Medicine

## 2017-04-15 DIAGNOSIS — G2581 Restless legs syndrome: Secondary | ICD-10-CM | POA: Diagnosis not present

## 2017-04-15 DIAGNOSIS — E7800 Pure hypercholesterolemia, unspecified: Secondary | ICD-10-CM | POA: Diagnosis not present

## 2017-04-15 DIAGNOSIS — Z79899 Other long term (current) drug therapy: Secondary | ICD-10-CM | POA: Insufficient documentation

## 2017-04-15 DIAGNOSIS — R21 Rash and other nonspecific skin eruption: Secondary | ICD-10-CM

## 2017-04-15 DIAGNOSIS — L258 Unspecified contact dermatitis due to other agents: Secondary | ICD-10-CM | POA: Diagnosis not present

## 2017-04-15 DIAGNOSIS — J45909 Unspecified asthma, uncomplicated: Secondary | ICD-10-CM | POA: Diagnosis not present

## 2017-04-15 HISTORY — DX: Pure hypercholesterolemia, unspecified: E78.00

## 2017-04-15 LAB — WET PREP, GENITAL
Clue Cells Wet Prep HPF POC: NONE SEEN
SPERM: NONE SEEN
Trich, Wet Prep: NONE SEEN
Yeast Wet Prep HPF POC: NONE SEEN

## 2017-04-15 MED ORDER — METHYLPREDNISOLONE SODIUM SUCC 125 MG IJ SOLR
125.0000 mg | Freq: Once | INTRAMUSCULAR | Status: AC
  Administered 2017-04-15: 125 mg via INTRAMUSCULAR

## 2017-04-15 MED ORDER — PREDNISONE 10 MG (21) PO TBPK: ORAL_TABLET | ORAL | 0 refills | Status: DC

## 2017-04-15 MED ORDER — TRIAMCINOLONE ACETONIDE 0.1 % EX CREA: 1.0000 "application " | TOPICAL_CREAM | Freq: Two times a day (BID) | CUTANEOUS | 0 refills | Status: DC | PRN

## 2017-04-15 NOTE — ED Provider Notes (Signed)
CSN: [id number]     Arrival date & time 04/15/17  0857 History   First MD Initiated Contact with Patient 04/15/17 [phone]     Chief Complaint  Patient presents with  . Rash   (Consider location/radiation/quality/duration/timing/severity/associated sxs/prior Treatment) 61 year old female presents with rash that started on ears/ wrist and groin area over 1 week ago- has spread to beneath both breasts, neck, and upper thighs. Uncertain of cause but has been working in the yard as well as went to [employer] and exposed to the sun, salt water and small insects in the sand. No new soaps or detergents. Has tried OTC hydrocortisone cream and OTC antifungal vaginal cream and Vagisil with no relief. Rash is very itchy. Requests injection for treatment if possible. Has history of Restless leg syndrome, high cholesterol, mood disorder and asthma- on maintenance medications.    The history is provided by the patient.    Past Medical History:  Diagnosis Date  . Asthma   . Ear pain   . Hypercholesteremia   . Restless legs   . Seizures ([place])    Past Surgical History:  Procedure Laterality Date  . ABDOMINAL HYSTERECTOMY    . INNER EAR SURGERY Right   . NASAL SINUS SURGERY     No family history on file. Social History  Substance Use Topics  . Smoking status: Never Smoker  . Smokeless tobacco: Never Used  . Alcohol use No   OB History    No data available     Review of Systems  Constitutional: Negative for appetite change, chills, fatigue and fever.  HENT: Negative for congestion, ear discharge, facial swelling, mouth sores, sneezing, sore throat and trouble swallowing.   Eyes: Negative for discharge, redness and visual disturbance.  Respiratory: Negative for cough, chest tightness, shortness of breath and wheezing.   Gastrointestinal: Negative for abdominal pain, nausea and vomiting.  Genitourinary: Negative for pelvic pain, vaginal discharge and vaginal pain.  Musculoskeletal: Negative for  joint swelling.  Skin: Positive for rash.  Neurological: Negative for dizziness, seizures, syncope, weakness, numbness and headaches.  Hematological: Negative for adenopathy.  Psychiatric/Behavioral: The patient is nervous/anxious.     Allergies  Patient has no known allergies.  Home Medications   Prior to Admission medications   Medication Sig Start Date End Date Taking? Authorizing Provider  albuterol (PROVENTIL HFA;VENTOLIN HFA) 108 (90 BASE) MCG/ACT inhaler Inhale 1-2 puffs into the lungs every 6 (six) hours as needed for wheezing or shortness of breath. 08/14/15  Yes Betancourt, Aura Fey, NP  lubiprostone (AMITIZA) 8 MCG capsule Take 8 mcg by mouth 2 (two) times daily with a meal.   Yes [provider]  PRAMIPEXOLE DIHYDROCHLORIDE PO Take by mouth.   Yes [provider]  simvastatin (ZOCOR) 20 MG tablet Take 20 mg by mouth daily.   Yes [provider]  topiramate (TOPAMAX) 100 MG tablet Take 100 mg by mouth 2 (two) times daily.   Yes [provider]  predniSONE (STERAPRED UNI-PAK 21 TAB) 10 MG (21) TBPK tablet Take 6 tabs by mouth on day 1 then decrease by 1 tablet each day until finished on day 6. 04/15/17   Brighten Orndoff, Nicholes Stairs, NP  triamcinolone cream (KENALOG) 0.1 % Apply 1 application topically 2 (two) times daily as needed. To affected areas 04/15/17   Katy Apo, NP   Meds Ordered and Administered this Visit   Medications  methylPREDNISolone sodium succinate (SOLU-MEDROL) 125 mg/2 mL injection 125 mg (125 mg Intramuscular  Given 04/15/17 1020)    BP 122/72 (BP Location: Left Arm)   Pulse 76   Temp 97.7 F (36.5 C) (Oral)   Resp 16   Ht 5' (1.524 m)   Wt 159 lb (72.1 kg)   SpO2 98%   BMI 31.05 kg/m  No data found.   Physical Exam  Constitutional: She is oriented to person, place, and time. She appears well-developed and well-nourished. No distress.  Neck: Normal range of motion. Neck supple.  Cardiovascular: Normal rate, regular  rhythm and normal heart sounds.   No murmur heard. Pulmonary/Chest: Effort normal and breath sounds normal. No respiratory distress. She has no decreased breath sounds. She has no wheezes.  Abdominal: Soft. There is no tenderness.  Genitourinary:    Pelvic exam was performed with patient in the knee-chest position. There is rash and tenderness on the right labia. There is no injury on the right labia. There is rash and tenderness on the left labia. There is no injury on the left labia. Right adnexum displays no mass. Left adnexum displays no mass. There is erythema and tenderness in the vagina. No bleeding in the vagina. No foreign body in the vagina. No vaginal discharge found.  Genitourinary Comments: Pink papular lesions present on external labial area and groin/suprapubic area. Some surrounding erythema present. No crusting or discharge but slight excoriation on suprapubic area from scratching. Internal vaginal exam shows some erythema but minimal clear to white discharge present with no odor.   Musculoskeletal: Normal range of motion.  Lymphadenopathy:    She has no cervical adenopathy.       Right: No inguinal adenopathy present.       Left: No inguinal adenopathy present.  Neurological: She is alert and oriented to person, place, and time.  Skin: Skin is warm and dry. Rash noted. Rash is papular (scattered pink lesions present on back of neck, under breasts bilaterally and upper thighs. ).     Psychiatric: Her speech is normal. Thought content normal. Her mood appears anxious.    Urgent Care Course     Procedures (including critical care time)  Labs Review Labs Reviewed  WET PREP, GENITAL - Abnormal; Notable for the following:       Result Value   WBC, Wet Prep HPF POC FEW (*)    All other components within normal limits    Imaging Review No results found.   Visual Acuity Review  Right Eye Distance:   Left Eye Distance:   Bilateral Distance:    Right Eye Near:     Left Eye Near:    Bilateral Near:         MDM   1. Contact dermatitis due to other agent, unspecified contact dermatitis type    Reviewed wet prep results with patient- no distinct yeast or bacterial infection. Appears to be more contact dermatitis. Gave Solu-Medrol 125mg  IM now to help with rash and itching. May start Prednisone 10mg  6 day dose pack as directed. May use Triamcinolone cream twice a day to affected areas as needed- use sparingly around groin and breast.  May take Benadryl 25mg  every 6 hours as needed for itching. Apply cool compresses to area for comfort. Recommend follow-up with her primary care provider in 2 to 3 days if not improving or go to ER if symptoms worsen.     Katy Apo, NP 04/15/17 [phone]

## 2017-04-15 NOTE — Discharge Instructions (Signed)
You were given a steroid shot today to help with the rash and itching. Recommend start Prednisone oral tablets- take 6 tablets today and then decrease by 1 tablet each day until finished on day 6. May apply Triamcinolone cream twice a day to affected areas as needed. May take OTC Benadryl 25mg  every 6 hours as needed for itching. Apply cool compresses to area for comfort. Follow-up with your primary care provider in 2 to 3 days if not improving.

## 2017-04-15 NOTE — ED Triage Notes (Signed)
Per patient c/o vaginal rash. Patient stated itching and burning .

## 2017-08-07 ENCOUNTER — Inpatient Hospital Stay
Admission: EM | Admit: 2017-08-07 | Discharge: 2017-08-09 | DRG: 190 | Disposition: A | Discharge status: Home / Self Care | Payer: Medicare Other | Attending: Internal Medicine | Admitting: Internal Medicine

## 2017-08-07 ENCOUNTER — Emergency Department: Payer: Medicare Other

## 2017-08-07 ENCOUNTER — Encounter: Payer: Self-pay | Admitting: Emergency Medicine

## 2017-08-07 DIAGNOSIS — B349 Viral infection, unspecified: Secondary | ICD-10-CM | POA: Diagnosis present

## 2017-08-07 DIAGNOSIS — E785 Hyperlipidemia, unspecified: Secondary | ICD-10-CM | POA: Diagnosis present

## 2017-08-07 DIAGNOSIS — J441 Chronic obstructive pulmonary disease with (acute) exacerbation: Secondary | ICD-10-CM | POA: Diagnosis present

## 2017-08-07 DIAGNOSIS — R0602 Shortness of breath: Secondary | ICD-10-CM | POA: Diagnosis not present

## 2017-08-07 DIAGNOSIS — R569 Unspecified convulsions: Secondary | ICD-10-CM | POA: Diagnosis present

## 2017-08-07 DIAGNOSIS — G2581 Restless legs syndrome: Secondary | ICD-10-CM | POA: Diagnosis present

## 2017-08-07 DIAGNOSIS — E876 Hypokalemia: Secondary | ICD-10-CM | POA: Diagnosis present

## 2017-08-07 DIAGNOSIS — J45909 Unspecified asthma, uncomplicated: Secondary | ICD-10-CM

## 2017-08-07 DIAGNOSIS — E7800 Pure hypercholesterolemia, unspecified: Secondary | ICD-10-CM | POA: Diagnosis present

## 2017-08-07 DIAGNOSIS — J440 Chronic obstructive pulmonary disease with acute lower respiratory infection: Secondary | ICD-10-CM | POA: Diagnosis not present

## 2017-08-07 DIAGNOSIS — J209 Acute bronchitis, unspecified: Secondary | ICD-10-CM | POA: Diagnosis present

## 2017-08-07 DIAGNOSIS — F329 Major depressive disorder, single episode, unspecified: Secondary | ICD-10-CM | POA: Diagnosis present

## 2017-08-07 DIAGNOSIS — Z90710 Acquired absence of both cervix and uterus: Secondary | ICD-10-CM | POA: Diagnosis not present

## 2017-08-07 DIAGNOSIS — J9600 Acute respiratory failure, unspecified whether with hypoxia or hypercapnia: Secondary | ICD-10-CM | POA: Diagnosis present

## 2017-08-07 DIAGNOSIS — J40 Bronchitis, not specified as acute or chronic: Secondary | ICD-10-CM

## 2017-08-07 HISTORY — DX: Malignant (primary) neoplasm, unspecified: C80.1

## 2017-08-07 LAB — COMPREHENSIVE METABOLIC PANEL
ALBUMIN: 3.8 g/dL (ref 3.5–5.0)
ALK PHOS: 50 U/L (ref 38–126)
ALT: 17 U/L (ref 14–54)
AST: 22 U/L (ref 15–41)
Anion gap: 8 (ref 5–15)
BILIRUBIN TOTAL: 0.7 mg/dL (ref 0.3–1.2)
BUN: 13 mg/dL (ref 6–20)
CALCIUM: 9.1 mg/dL (ref 8.9–10.3)
CO2: 23 mmol/L (ref 22–32)
Chloride: 110 mmol/L (ref 101–111)
Creatinine, Ser: 0.79 mg/dL (ref 0.44–1.00)
GFR calc Af Amer: >60 mL/min (ref >60)
GLUCOSE: 160 mg/dL — AB (ref 65–99)
Potassium: 2.9 mmol/L — ABNORMAL LOW (ref 3.5–5.1)
Sodium: 141 mmol/L (ref 135–145)
TOTAL PROTEIN: 7.3 g/dL (ref 6.5–8.1)

## 2017-08-07 LAB — CBC
HEMATOCRIT: 40.1 % (ref 35.0–47.0)
HEMOGLOBIN: 13.8 g/dL (ref 12.0–16.0)
MCH: 31 pg (ref 26.0–34.0)
MCHC: 34.5 g/dL (ref 32.0–36.0)
MCV: 89.9 fL (ref 80.0–100.0)
Platelets: 256 10*3/uL (ref 150–440)
RBC: 4.46 MIL/uL (ref 3.80–5.20)
RDW: 13.4 % (ref 11.5–14.5)
WBC: 7.4 10*3/uL (ref 3.6–11.0)

## 2017-08-07 LAB — BASIC METABOLIC PANEL
ANION GAP: 7 (ref 5–15)
BUN: 13 mg/dL (ref 6–20)
CALCIUM: 9.1 mg/dL (ref 8.9–10.3)
CO2: 19 mmol/L — ABNORMAL LOW (ref 22–32)
Chloride: 114 mmol/L — ABNORMAL HIGH (ref 101–111)
Creatinine, Ser: 0.77 mg/dL (ref 0.44–1.00)
Glucose, Bld: 189 mg/dL — ABNORMAL HIGH (ref 65–99)
POTASSIUM: 4.3 mmol/L (ref 3.5–5.1)
SODIUM: 140 mmol/L (ref 135–145)

## 2017-08-07 LAB — MAGNESIUM: MAGNESIUM: 1.9 mg/dL (ref 1.7–2.4)

## 2017-08-07 LAB — BRAIN NATRIURETIC PEPTIDE: B Natriuretic Peptide: 25 pg/mL (ref 0.0–100.0)

## 2017-08-07 LAB — TROPONIN I

## 2017-08-07 MED ORDER — ACETAMINOPHEN 325 MG PO TABS
650.0000 mg | ORAL_TABLET | Freq: Four times a day (QID) | ORAL | Status: DC | PRN
  Administered 2017-08-07: 15:00:00 650 mg via ORAL
  Filled 2017-08-07: qty 2

## 2017-08-07 MED ORDER — IPRATROPIUM-ALBUTEROL 0.5-2.5 (3) MG/3ML IN SOLN
3.0000 mL | Freq: Four times a day (QID) | RESPIRATORY_TRACT | Status: DC
  Administered 2017-08-07 – 2017-08-09 (×8): 3 mL via RESPIRATORY_TRACT
  Filled 2017-08-07 (×8): qty 3

## 2017-08-07 MED ORDER — VENLAFAXINE HCL ER 75 MG PO CP24
150.0000 mg | ORAL_CAPSULE | Freq: Every day | ORAL | Status: DC
  Administered 2017-08-08: 150 mg via ORAL
  Filled 2017-08-07: qty 2

## 2017-08-07 MED ORDER — POLYETHYLENE GLYCOL 3350 17 G PO PACK: 17.0000 g | PACK | Freq: Every day | ORAL | Status: DC | PRN

## 2017-08-07 MED ORDER — TOPIRAMATE 100 MG PO TABS
100.0000 mg | ORAL_TABLET | Freq: Two times a day (BID) | ORAL | Status: DC
  Administered 2017-08-07 – 2017-08-08 (×3): 100 mg via ORAL
  Filled 2017-08-07 (×5): qty 1

## 2017-08-07 MED ORDER — ALBUTEROL SULFATE (2.5 MG/3ML) 0.083% IN NEBU: 2.5000 mg | INHALATION_SOLUTION | RESPIRATORY_TRACT | Status: DC | PRN

## 2017-08-07 MED ORDER — ENOXAPARIN SODIUM 40 MG/0.4ML ~~LOC~~ SOLN
40.0000 mg | SUBCUTANEOUS | Status: DC
  Administered 2017-08-07 – 2017-08-08 (×2): 40 mg via SUBCUTANEOUS
  Filled 2017-08-07 (×2): qty 0.4

## 2017-08-07 MED ORDER — ACETAMINOPHEN 650 MG RE SUPP: 650.0000 mg | Freq: Four times a day (QID) | RECTAL | Status: DC | PRN

## 2017-08-07 MED ORDER — IPRATROPIUM-ALBUTEROL 0.5-2.5 (3) MG/3ML IN SOLN
3.0000 mL | Freq: Once | RESPIRATORY_TRACT | Status: AC
  Administered 2017-08-07: 3 mL via RESPIRATORY_TRACT
  Filled 2017-08-07: qty 3

## 2017-08-07 MED ORDER — ALBUTEROL SULFATE (2.5 MG/3ML) 0.083% IN NEBU
3.0000 mL | INHALATION_SOLUTION | Freq: Four times a day (QID) | RESPIRATORY_TRACT | Status: DC | PRN
  Administered 2017-08-08: 3 mL via RESPIRATORY_TRACT
  Filled 2017-08-07: qty 3

## 2017-08-07 MED ORDER — OXYCODONE-ACETAMINOPHEN 5-325 MG PO TABS
1.0000 | ORAL_TABLET | Freq: Four times a day (QID) | ORAL | Status: DC | PRN
  Administered 2017-08-07: 21:00:00 1 via ORAL
  Filled 2017-08-07: qty 1

## 2017-08-07 MED ORDER — ALBUTEROL SULFATE (2.5 MG/3ML) 0.083% IN NEBU
5.0000 mg | INHALATION_SOLUTION | Freq: Once | RESPIRATORY_TRACT | Status: AC
  Administered 2017-08-07: 5 mg via RESPIRATORY_TRACT
  Filled 2017-08-07: qty 6

## 2017-08-07 MED ORDER — SIMVASTATIN 20 MG PO TABS
20.0000 mg | ORAL_TABLET | Freq: Every day | ORAL | Status: DC
  Administered 2017-08-07 – 2017-08-08 (×2): 20 mg via ORAL
  Filled 2017-08-07 (×2): qty 1

## 2017-08-07 MED ORDER — PRAMIPEXOLE DIHYDROCHLORIDE 1 MG PO TABS
1.0000 mg | ORAL_TABLET | Freq: Two times a day (BID) | ORAL | Status: DC
  Administered 2017-08-07 – 2017-08-08 (×3): 1 mg via ORAL
  Filled 2017-08-07 (×4): qty 1

## 2017-08-07 MED ORDER — METHYLPREDNISOLONE SODIUM SUCC 125 MG IJ SOLR
60.0000 mg | Freq: Four times a day (QID) | INTRAMUSCULAR | Status: DC
  Administered 2017-08-07 – 2017-08-09 (×7): 60 mg via INTRAVENOUS
  Filled 2017-08-07 (×7): qty 2

## 2017-08-07 MED ORDER — SODIUM CHLORIDE 0.9 % IV SOLN
INTRAVENOUS | Status: AC
  Administered 2017-08-07 – 2017-08-08 (×2): via INTRAVENOUS

## 2017-08-07 MED ORDER — LUBIPROSTONE 8 MCG PO CAPS
8.0000 ug | ORAL_CAPSULE | Freq: Two times a day (BID) | ORAL | Status: DC
  Administered 2017-08-07 – 2017-08-08 (×3): 8 ug via ORAL
  Filled 2017-08-07 (×4): qty 1

## 2017-08-07 MED ORDER — POTASSIUM CHLORIDE CRYS ER 20 MEQ PO TBCR
40.0000 meq | EXTENDED_RELEASE_TABLET | ORAL | Status: AC
  Administered 2017-08-07 (×2): 40 meq via ORAL
  Filled 2017-08-07 (×2): qty 2

## 2017-08-07 NOTE — H&P (Signed)
[facility] [place] NAME: Cynthia Fisher    MR#:  [id number]  DATE OF BIRTH:  Jan 28, 1956  DATE OF ADMISSION:  08/07/2017  PRIMARY CARE PHYSICIAN: Lynnell Jude, MD   REQUESTING/REFERRING PHYSICIAN: Lavonia Drafts, MD  CHIEF COMPLAINT:   sob HISTORY OF PRESENT ILLNESS:  Cynthia Fisher  is a 61 y.o. female with a known history of Hyperlipidemia, restless leg syndrome is presenting to the ED with a chief complaint of shortness of breath. Patient has dry cough and shortness of breath is progressively getting worse for the past 2 days and today a.m. at around 2:00 she couldn't breathe, started wheezing and no similar complaints in the past. No sick contacts. Denies any fever. Denies any chest pain, chest x-ray with no pneumonia  PAST MEDICAL HISTORY:   Past Medical History:  Diagnosis Date  . Asthma   . Ear pain   . Hypercholesteremia   . Restless legs   . Seizures ([place])     PAST SURGICAL HISTOIRY:   Past Surgical History:  Procedure Laterality Date  . ABDOMINAL HYSTERECTOMY    . INNER EAR SURGERY Right   . NASAL SINUS SURGERY      SOCIAL HISTORY:   Social History  Substance Use Topics  . Smoking status: Never Smoker  . Smokeless tobacco: Never Used  . Alcohol use No    FAMILY HISTORY:  Family history reviewed, hyperlipidemia runs in her family  DRUG ALLERGIES:  No Known Allergies  REVIEW OF SYSTEMS:  CONSTITUTIONAL: No fever, fatigue or weakness.  EYES: No blurred or double vision.  EARS, NOSE, AND THROAT: No tinnitus or ear pain.  RESPIRATORY: Reporting cough, shortness of breath, wheezing , denies hemoptysis.  CARDIOVASCULAR: No chest pain, orthopnea, edema.  GASTROINTESTINAL: No nausea, vomiting, diarrhea or abdominal pain.  GENITOURINARY: No dysuria, hematuria.  ENDOCRINE: No polyuria, nocturia,  HEMATOLOGY: No anemia, easy bruising or bleeding SKIN: No rash or lesion. MUSCULOSKELETAL: No joint pain  or arthritis.   NEUROLOGIC: No tingling, numbness, weakness.  PSYCHIATRY: No anxiety or depression.   MEDICATIONS AT HOME:   Prior to Admission medications   Medication Sig Start Date End Date Taking? Authorizing Provider  lubiprostone (AMITIZA) 8 MCG capsule Take 8 mcg by mouth 2 (two) times daily with a meal.   Yes [provider]  pramipexole (MIRAPEX) 1 MG tablet Take 1 tablet by mouth 2 (two) times daily.    Yes [provider]  simvastatin (ZOCOR) 20 MG tablet Take 20 mg by mouth daily.   Yes [provider]  topiramate (TOPAMAX) 100 MG tablet Take 100 mg by mouth 2 (two) times daily.   Yes [provider]  venlafaxine XR (EFFEXOR-XR) 150 MG 24 hr capsule Take 150 mg by mouth daily with breakfast.   Yes [provider]  albuterol (PROVENTIL HFA;VENTOLIN HFA) 108 (90 BASE) MCG/ACT inhaler Inhale 1-2 puffs into the lungs every 6 (six) hours as needed for wheezing or shortness of breath. 08/14/15   Betancourt, Aura Fey, NP  predniSONE (STERAPRED UNI-PAK 21 TAB) 10 MG (21) TBPK tablet Take 6 tabs by mouth on day 1 then decrease by 1 tablet each day until finished on day 6. Patient not taking: Reported on 08/07/2017 04/15/17   Katy Apo, NP  triamcinolone cream (KENALOG) 0.1 % Apply 1 application topically 2 (two) times daily as needed. To affected areas Patient not taking: Reported on 08/07/2017 04/15/17   Katy Apo, NP  VITAL SIGNS:  Blood pressure 123/68, pulse (!) 115, resp. rate 20, height 5' (1.524 m), weight 68 kg (150 lb), SpO2 99 %.  PHYSICAL EXAMINATION:  GENERAL:  61 y.o.-year-old patient lying in the bed with no acute distress.  EYES: Pupils equal, round, reactive to light and accommodation. No scleral icterus. Extraocular muscles intact.  HEENT: Head atraumatic, normocephalic. Oropharynx and nasopharynx clear.  NECK:  Supple, no jugular venous distention. No thyroid enlargement, no tenderness.  LUNGS: Moderately  diminished breath sounds bilaterally, diffuse wheezing, no rales,rhonchi or crepitation. No use of accessory muscles of respiration.  CARDIOVASCULAR: S1, S2 normal. No murmurs, rubs, or gallops.  ABDOMEN: Soft, nontender, nondistended. Bowel sounds present. No organomegaly or mass.  EXTREMITIES: No pedal edema, cyanosis, or clubbing.  NEUROLOGIC: Cranial nerves II through XII are intact. Muscle strength 5/5 in all extremities. Sensation intact. Gait not checked.  PSYCHIATRIC: The patient is alert and oriented x 3.  SKIN: No obvious rash, lesion, or ulcer.   LABORATORY PANEL:   CBC  Recent Labs Lab 08/07/17 1000  WBC 7.4  HGB 13.8  HCT 40.1  PLT 256   ------------------------------------------------------------------------------------------------------------------  Chemistries   Recent Labs Lab 08/07/17 1000  NA 141  K 2.9*  CL 110  CO2 23  GLUCOSE 160*  BUN 13  CREATININE 0.79  CALCIUM 9.1  AST 22  ALT 17  ALKPHOS 50  BILITOT 0.7   ------------------------------------------------------------------------------------------------------------------  Cardiac Enzymes  Recent Labs Lab 08/07/17 1000  TROPONINI <0.03   ------------------------------------------------------------------------------------------------------------------  RADIOLOGY:  Dg Chest 2 View  Result Date: 08/07/2017 CLINICAL DATA:  Shortness of breath, chest tightness, wheezing. History of asthma. Never smoked. EXAM: CHEST  2 VIEW COMPARISON:  PA and lateral chest x-ray of January 24, 2017 FINDINGS: The lungs are adequately inflated. The interstitial markings are slightly more conspicuous overall today. There is new linear increased density at the left lung base. The heart and pulmonary vascularity are normal. There is calcification in the wall of the aortic arch. A nerve stimulator generator overlies the left mid hemithorax with the electrode extending to the left lower neck. There is gentle curvature of  the thoracolumbar spine convex toward the left. IMPRESSION: Slight interval increase in the interstitial markings suggest acute bronchitic change superimposed upon underlying reactive airway disease. No CHF or alveolar pneumonia. Electronically Signed   By: David  Martinique M.D.   On: 08/07/2017 09:54    EKG:   Orders placed or performed during the hospital encounter of 08/07/17  . ED EKG  . ED EKG  . EKG 12-Lead  . EKG 12-Lead    IMPRESSION AND PLAN:   Cynthia Fisher  is a 61 y.o. female with a known history of Hyperlipidemia, restless leg syndrome is presenting to the ED with a chief complaint of shortness of breath. Patient has dry cough and shortness of breath is progressively getting worse for the past 2 days and today a.m. at around 2:00 she couldn't breathe, started wheezing and no similar complaints in the past. No sick contacts. Denies any fever. Denies any chest pain,  #Shortness of breath from acute bronchitis with bronchoconstriction IV Solu-Medrol and nebulizer treatments Azithromycin  #Sinus tachycardia could be from the nebulizer treatments Provide IV fluids Patient is asymptomatic   #Hyperlipidemia  Continue home medication statin  #Hypokalemia Replete potassium, repeat labs including magnesium  DVT prophylaxis with Lovenox subcutaneous and GI prophylaxis    All the records are reviewed and case discussed with ED provider. Management plans discussed with the  patient, family and they are in agreement.  CODE STATUS: fc, daughter Health visitor  TOTAL TIME TAKING CARE OF THIS PATIENT: 43 minutes.   Note: This dictation was prepared with Dragon dictation along with smaller phrase technology. Any transcriptional errors that result from this process are unintentional.  Nicholes Mango M.D on 08/07/2017 at 1:08 PM  Between 7am to 6pm - Pager - [phone]  After 6pm go to [email or web address] - password [email or web address] [facility]  [facility]  Office   [phone]  CC: Primary care physician; Lynnell Jude, MD

## 2017-08-07 NOTE — ED Provider Notes (Signed)
[facility] Emergency Department Provider Note   ____________________________________________    I have reviewed the triage vital signs and the nursing notes.   HISTORY  Chief Complaint Shortness of Breath     HPI Cynthia Fisher is a 61 y.o. female Who presents with complaints of shortness of breath. Patient reports she has had a dry cough the last 2 days and reports worsening shortness of breath. She reports she has never felt like this before. She does not smoke. Distant history of asthma. No fevers or chills. No recent travel. No calf pain or swelling. No chest pain or pleurisy.   Past Medical History:  Diagnosis Date  . Asthma   . Ear pain   . Hypercholesteremia   . Restless legs   . Seizures ([place])     There are no active problems to display for this patient.   Past Surgical History:  Procedure Laterality Date  . ABDOMINAL HYSTERECTOMY    . INNER EAR SURGERY Right   . NASAL SINUS SURGERY      Prior to Admission medications   Medication Sig Start Date End Date Taking? Authorizing Provider  lubiprostone (AMITIZA) 8 MCG capsule Take 8 mcg by mouth 2 (two) times daily with a meal.   Yes [provider]  pramipexole (MIRAPEX) 1 MG tablet Take 1 tablet by mouth 2 (two) times daily.    Yes [provider]  simvastatin (ZOCOR) 20 MG tablet Take 20 mg by mouth daily.   Yes [provider]  topiramate (TOPAMAX) 100 MG tablet Take 100 mg by mouth 2 (two) times daily.   Yes [provider]  venlafaxine XR (EFFEXOR-XR) 150 MG 24 hr capsule Take 150 mg by mouth daily with breakfast.   Yes [provider]  albuterol (PROVENTIL HFA;VENTOLIN HFA) 108 (90 BASE) MCG/ACT inhaler Inhale 1-2 puffs into the lungs every 6 (six) hours as needed for wheezing or shortness of breath. 08/14/15   Betancourt, Aura Fey, NP  predniSONE (STERAPRED UNI-PAK 21 TAB) 10 MG (21) TBPK tablet Take 6 tabs by mouth on day 1 then  decrease by 1 tablet each day until finished on day 6. Patient not taking: Reported on 08/07/2017 04/15/17   Katy Apo, NP  triamcinolone cream (KENALOG) 0.1 % Apply 1 application topically 2 (two) times daily as needed. To affected areas Patient not taking: Reported on 08/07/2017 04/15/17   Katy Apo, NP     Allergies Patient has no known allergies.  No family history on file.  Social History Social History  Substance Use Topics  . Smoking status: Never Smoker  . Smokeless tobacco: Never Used  . Alcohol use No    Review of Systems  Constitutional: No fever/chills Eyes: No visual changes.  ENT: No throat swelling Cardiovascular: Denies chest pain. Respiratory: as above Gastrointestinal: No abdominal pain.  No nausea, no vomiting.   Genitourinary: Negative for dysuria. Musculoskeletal: Negative for back pain. Skin: Negative for rash. Neurological: Negative for headaches   ____________________________________________   PHYSICAL EXAM:  VITAL SIGNS: ED Triage Vitals  Enc Vitals Group     BP 08/07/17 0930 (!) 161/78     Pulse Rate 08/07/17 0930 (!) 105     Resp 08/07/17 0930 18     Temp --      Temp src --      SpO2 08/07/17 0930 100 %     Weight 08/07/17 0931 68 kg (150 lb)     Height  08/07/17 0931 1.524 m (5')     Head Circumference --      Peak Flow --      Pain Score 08/07/17 0930 4     Pain Loc --      Pain Edu? --      Excl. in [place]? --     Constitutional: Alert and oriented. No acute distress. Pleasant and interactive Eyes: Conjunctivae are normal.   Nose: No congestion/rhinnorhea. Mouth/Throat: Mucous membranes are moist.    Cardiovascular: tachycardia, regular rhythm. Grossly normal heart sounds.  Good peripheral circulation. Respiratory: increased respiratory effort with tachypnea, audible wheezing Gastrointestinal: Soft and nontender. No distention.  No CVA tenderness. Genitourinary: deferred Musculoskeletal: No lower extremity  tenderness nor edema.  Warm and well perfused Neurologic:  Normal speech and language. No gross focal neurologic deficits are appreciated.  Skin:  Skin is warm, dry and intact. No rash noted. Psychiatric: Mood and affect are normal. Speech and behavior are normal.  ____________________________________________   LABS (all labs ordered are listed, but only abnormal results are displayed)  Labs Reviewed  COMPREHENSIVE METABOLIC PANEL - Abnormal; Notable for the following:       Result Value   Potassium 2.9 (*)    Glucose, Bld 160 (*)    All other components within normal limits  CBC  TROPONIN I  BRAIN NATRIURETIC PEPTIDE   ____________________________________________  EKG  ED ECG REPORT I, Lavonia Drafts, the attending physician, personally viewed and interpreted this ECG.  Date: 08/07/2017 EKG Time: 9:40 AM Rate: 125 Rhythm: normal sinus rhythm QRS Axis: normal Intervals: normal ST/T Wave abnormalities:nonspecific changes Narrative Interpretation: no evidence of acute ischemia  ____________________________________________  RADIOLOGY  Chest x-ray demonstrates bronchitic changes ____________________________________________   PROCEDURES  Procedure(s) performed: No    Critical Care performed: No ____________________________________________   INITIAL IMPRESSION / ASSESSMENT AND PLAN / ED COURSE  Pertinent labs & imaging results that were available during my care of the patient were reviewed by me and considered in my medical decision making (see chart for details).  Patient presents with shortness of breath with audible wheezing. Differential diagnosis includes reactive airway disease, asthma, pneumonia. Medical record is noncontributory  We will treat with DuoNeb, patient received Solu-Medrol via EMS. Afebrile and chest x-ray most consistent with reactive airway disease, no evidence of pneumonia.  After multiple treatments patient continues to be wheezing  diffusely and continues to be tachypneic. She will require admission to the hospitalist service    ____________________________________________   FINAL CLINICAL IMPRESSION(S) / ED DIAGNOSES  Final diagnoses:  SOB (shortness of breath)  Bronchitis  Reactive airway disease without complication, unspecified asthma severity, unspecified whether persistent      NEW MEDICATIONS STARTED DURING THIS VISIT:  New Prescriptions   No medications on file     Note:  This document was prepared using Dragon voice recognition software and may include unintentional dictation errors.    Lavonia Drafts, MD 08/07/17 1209

## 2017-08-07 NOTE — ED Notes (Signed)
Patient transported to radiology

## 2017-08-07 NOTE — ED Triage Notes (Signed)
Patient presents to ED via [facility] from home with c/o SOB. EMS reports wheezes noted to all lobes. They gave 15mg  of albuterol and 125 of solu medrol. Patient also reports chest tightness. Patient able to speak complete sentences. Auditory wheezes noted.

## 2017-08-07 NOTE — ED Notes (Signed)
This RN walked with patient around nurse's station monitoring oxygen saturation.  Patient's oxygen never dropped below 95% on room air.  Patient continues to have audible wheezing.  Patient ambulating with steady gait and without distress.

## 2017-08-07 NOTE — ED Notes (Signed)
Lab called and state they need a recollect on a lavender top. Blood redrawn by this RN and sent to lab.

## 2017-08-07 NOTE — ED Notes (Signed)
Upon answering call light patient found to be sitting up in bed, crying. Patient stating, "I cant breathe". SpO2 100%. Respiratory rate 45. ST on monitor with a rate of 145. After coached breathing heart and respiratory rate back to patients baseline upon arrival, 120's and 27 breaths per minute. Patient verbalizes "I'm sorry. I panicked. I feel better now". Call light within reach. Emotional support provided.

## 2017-08-07 NOTE — Progress Notes (Signed)
HIGH FALL RISK per protocol due to fall 11 days ago from seizure. Pt states she hasn't had a seizure in a very long time but was recently very stressed. Steady gait observed, refuses bed alarm. Non-skid socks in place, safe environment promoted. Educated patient to call for assistance before getting out of bed.

## 2017-08-07 NOTE — Progress Notes (Addendum)
MEDICATION RELATED CONSULT NOTE - INITIAL   Pharmacy Consult for electrolyte management Indication: hypokalemia  No Known Allergies  Patient Measurements: Height: 5' (152.4 cm) Weight: 150 lb (68 kg) IBW/kg (Calculated) : 45.5 Adjusted Body Weight:   Vital Signs: BP: 132/97 (10/02 1100) Pulse Rate: 126 (10/02 1100) Intake/Output from previous day: No intake/output data recorded. Intake/Output from this shift: No intake/output data recorded.  Labs:  Recent Labs  08/07/17 1000  WBC 7.4  HGB 13.8  HCT 40.1  PLT 256  CREATININE 0.79  ALBUMIN 3.8  PROT 7.3  AST 22  ALT 17  ALKPHOS 50  BILITOT 0.7   Estimated Creatinine Clearance: 64.3 mL/min (by C-G formula based on SCr of 0.79 mg/dL).   Microbiology: No results found for this or any previous visit (from the past 720 hour(s)).  Medical History: Past Medical History:  Diagnosis Date  . Asthma   . Ear pain   . Hypercholesteremia   . Restless legs   . Seizures ([facility])     Medications:  Infusions:    Assessment: 60 yof cc SOB with PMH HLD, RLS, seizures, asthma, ear pain. Hypokalemia noted in ED. Pharmacy consulted to manage electrolytes. Magnesium add-on returned 1.9.  Goal of Therapy:  Electrolytes WNL.   Plan:  Prescriber ordered Klor-Con 40 mEq po Q4H x 2 doses. Continue current dosing, will recheck BMP tonight 6 hours after last KCl dose and will recheck tomorrow with AM labs.  Laural Benes, Pharm.D., BCPS Clinical Pharmacist 08/07/2017,12:40 PM    10/03 2300 K+ 4.3. NO replacement indicated. Recheck BMP in AM.  Sim Boast, PharmD, BCPS  08/08/17 12:15 AM

## 2017-08-08 ENCOUNTER — Inpatient Hospital Stay: Payer: Medicare Other

## 2017-08-08 ENCOUNTER — Encounter: Payer: Self-pay | Admitting: Radiology

## 2017-08-08 LAB — COMPREHENSIVE METABOLIC PANEL
ALT: 20 U/L (ref 14–54)
AST: 33 U/L (ref 15–41)
Albumin: 3.8 g/dL (ref 3.5–5.0)
Alkaline Phosphatase: 43 U/L (ref 38–126)
Anion gap: 6 (ref 5–15)
BUN: 12 mg/dL (ref 6–20)
CHLORIDE: 116 mmol/L — AB (ref 101–111)
CO2: 19 mmol/L — ABNORMAL LOW (ref 22–32)
Calcium: 9.1 mg/dL (ref 8.9–10.3)
Creatinine, Ser: 0.64 mg/dL (ref 0.44–1.00)
GFR calc Af Amer: >60 mL/min (ref >60)
Glucose, Bld: 173 mg/dL — ABNORMAL HIGH (ref 65–99)
POTASSIUM: 4 mmol/L (ref 3.5–5.1)
Sodium: 141 mmol/L (ref 135–145)
Total Bilirubin: 0.4 mg/dL (ref 0.3–1.2)
Total Protein: 7.1 g/dL (ref 6.5–8.1)

## 2017-08-08 LAB — CBC
HCT: 37.7 % (ref 35.0–47.0)
Hemoglobin: 12.8 g/dL (ref 12.0–16.0)
MCH: 30.7 pg (ref 26.0–34.0)
MCHC: 33.9 g/dL (ref 32.0–36.0)
MCV: 90.5 fL (ref 80.0–100.0)
PLATELETS: 247 10*3/uL (ref 150–440)
RBC: 4.16 MIL/uL (ref 3.80–5.20)
RDW: 13.5 % (ref 11.5–14.5)
WBC: 20.8 10*3/uL — AB (ref 3.6–11.0)

## 2017-08-08 LAB — RESPIRATORY PANEL BY PCR
Adenovirus: NOT DETECTED
Bordetella pertussis: NOT DETECTED
CORONAVIRUS HKU1-RVPPCR: NOT DETECTED
CORONAVIRUS OC43-RVPPCR: NOT DETECTED
Chlamydophila pneumoniae: NOT DETECTED
Coronavirus 229E: NOT DETECTED
Coronavirus NL63: NOT DETECTED
INFLUENZA B-RVPPCR: NOT DETECTED
Influenza A: NOT DETECTED
MYCOPLASMA PNEUMONIAE-RVPPCR: NOT DETECTED
Metapneumovirus: NOT DETECTED
PARAINFLUENZA VIRUS 1-RVPPCR: NOT DETECTED
PARAINFLUENZA VIRUS 2-RVPPCR: NOT DETECTED
Parainfluenza Virus 3: NOT DETECTED
Parainfluenza Virus 4: NOT DETECTED
RESPIRATORY SYNCYTIAL VIRUS-RVPPCR: NOT DETECTED
Rhinovirus / Enterovirus: NOT DETECTED

## 2017-08-08 LAB — MAGNESIUM: Magnesium: 1.9 mg/dL (ref 1.7–2.4)

## 2017-08-08 LAB — PHOSPHORUS: PHOSPHORUS: 2.5 mg/dL (ref 2.5–4.6)

## 2017-08-08 LAB — TSH: TSH: 0.307 u[IU]/mL — AB (ref 0.350–4.500)

## 2017-08-08 LAB — PROCALCITONIN

## 2017-08-08 MED ORDER — OXYCODONE-ACETAMINOPHEN 5-325 MG PO TABS
1.0000 | ORAL_TABLET | Freq: Four times a day (QID) | ORAL | Status: DC | PRN
  Administered 2017-08-08 (×2): 1 via ORAL
  Filled 2017-08-08 (×2): qty 1

## 2017-08-08 MED ORDER — IOPAMIDOL (ISOVUE-370) INJECTION 76%
75.0000 mL | Freq: Once | INTRAVENOUS | Status: AC | PRN
  Administered 2017-08-08: 75 mL via INTRAVENOUS

## 2017-08-08 NOTE — Progress Notes (Signed)
[facility] [place] NAME: Cynthia Fisher    MR#:  [id number]  DATE OF BIRTH:  1956-09-04  SUBJECTIVE:  CHIEF COMPLAINT:   Chief Complaint  Patient presents with  . Shortness of Breath  still very SOB, wheezing. REVIEW OF SYSTEMS:  Review of Systems  Constitutional: Negative for chills, fever and weight loss.  HENT: Negative for nosebleeds and sore throat.   Eyes: Negative for blurred vision.  Respiratory: Positive for cough, shortness of breath and wheezing.   Cardiovascular: Negative for chest pain, orthopnea, leg swelling and PND.  Gastrointestinal: Negative for abdominal pain, constipation, diarrhea, heartburn, nausea and vomiting.  Genitourinary: Negative for dysuria and urgency.  Musculoskeletal: Negative for back pain.  Skin: Negative for rash.  Neurological: Negative for dizziness, speech change, focal weakness and headaches.  Endo/Heme/Allergies: Does not bruise/bleed easily.  Psychiatric/Behavioral: Negative for depression.    DRUG ALLERGIES:  No Known Allergies VITALS:  Blood pressure 126/62, pulse 73, temperature 97.6 F (36.4 C), temperature source Oral, resp. rate 20, height 5' (1.524 m), weight 69.1 kg (152 lb 6.4 oz), SpO2 98 %. PHYSICAL EXAMINATION:  Physical Exam  Constitutional: She is oriented to person, place, and time and well-developed, well-nourished, and in no distress.  HENT:  Head: Normocephalic and atraumatic.  Eyes: Pupils are equal, round, and reactive to light. Conjunctivae and EOM are normal.  Neck: Normal range of motion. Neck supple. No tracheal deviation present. No thyromegaly present.  Cardiovascular: Normal rate, regular rhythm and normal heart sounds.   Pulmonary/Chest: Accessory muscle usage present. She is in respiratory distress. She has wheezes. She exhibits no tenderness.  Abdominal: Soft. Bowel sounds are normal. She exhibits no distension. There is no tenderness.  Musculoskeletal:  Normal range of motion.  Neurological: She is alert and oriented to person, place, and time. No cranial nerve deficit.  Skin: Skin is warm and dry. No rash noted.  Psychiatric: Mood and affect normal.   LABORATORY PANEL:  Female CBC  Recent Labs Lab 08/08/17 0530  WBC 20.8*  HGB 12.8  HCT 37.7  PLT 247   ------------------------------------------------------------------------------------------------------------------ Chemistries   Recent Labs Lab 08/08/17 0530  NA 141  K 4.0  CL 116*  CO2 19*  GLUCOSE 173*  BUN 12  CREATININE 0.64  CALCIUM 9.1  MG 1.9  AST 33  ALT 20  ALKPHOS 43  BILITOT 0.4   RADIOLOGY:  Dg Chest 2 View  Result Date: 08/07/2017 CLINICAL DATA:  Shortness of breath, chest tightness, wheezing. History of asthma. Never smoked. EXAM: CHEST  2 VIEW COMPARISON:  PA and lateral chest x-ray of January 24, 2017 FINDINGS: The lungs are adequately inflated. The interstitial markings are slightly more conspicuous overall today. There is new linear increased density at the left lung base. The heart and pulmonary vascularity are normal. There is calcification in the wall of the aortic arch. A nerve stimulator generator overlies the left mid hemithorax with the electrode extending to the left lower neck. There is gentle curvature of the thoracolumbar spine convex toward the left. IMPRESSION: Slight interval increase in the interstitial markings suggest acute bronchitic change superimposed upon underlying reactive airway disease. No CHF or alveolar pneumonia. Electronically Signed   By: David  Martinique M.D.   On: 08/07/2017 09:54   ASSESSMENT AND PLAN:  Cynthia Fisher  is a 61 y.o. female with a known history of Hyperlipidemia, restless leg syndrome presented for shortness of breath.   # Acute resp failure due  to Acute COPD exacerbation - continue IV Solu-Medrol and nebulizer treatments - continue Azithromycin - check Respi. Panel and obtain CT chest - She's likely  picked up viral illness from her grandson/mother who was sick  # Depression - continue Effexor   #Hyperlipidemia  Continue statin  #Hypokalemia Replete and recheck     All the records are reviewed and case discussed with Care Management/Social Worker. Management plans discussed with the patient, nursing and they are in agreement.  CODE STATUS: Full Code  TOTAL TIME TAKING CARE OF THIS PATIENT: 35 minutes.   More than 50% of the time was spent in counseling/coordination of care: YES  POSSIBLE D/C IN 1-2 DAYS, DEPENDING ON CLINICAL CONDITION.   Max Sane M.D on 08/08/2017 at 7:35 AM  Between 7am to 6pm - Pager - [phone]  After 6pm go to [email or web address] - Proofreader  [facility]  Office  [phone]  CC: Primary care physician; Lynnell Jude, MD  Note: This dictation was prepared with Dragon dictation along with smaller phrase technology. Any transcriptional errors that result from this process are unintentional.

## 2017-08-08 NOTE — Progress Notes (Signed)
MEDICATION RELATED CONSULT NOTE - INITIAL   Pharmacy Consult for electrolyte management Indication: hypokalemia  No Known Allergies  Patient Measurements: Height: 5' (152.4 cm) Weight: 152 lb 6.4 oz (69.1 kg) IBW/kg (Calculated) : 45.5  Vital Signs: Temp: 97.6 F (36.4 C) (10/03 0504) Temp Source: Oral (10/03 0504) BP: 126/62 (10/03 0504) Pulse Rate: 73 (10/03 0504)  Labs:  Recent Labs  08/07/17 1000 08/07/17 2257 08/08/17 0530  WBC 7.4  --  20.8*  HGB 13.8  --  12.8  HCT 40.1  --  37.7  PLT 256  --  247  CREATININE 0.79 0.77 0.64  MG 1.9  --  1.9  PHOS  --   --  2.5  ALBUMIN 3.8  --  3.8  PROT 7.3  --  7.1  AST 22  --  33  ALT 17  --  20  ALKPHOS 50  --  43  BILITOT 0.7  --  0.4   Estimated Creatinine Clearance: 64.8 mL/min (by C-G formula based on SCr of 0.64 mg/dL).  Assessment: Pharmacy consulted to monitor and replace electrolytes if needed in this 61 year old female who was hypokalemic on admission.  Goal of Therapy: Electrolytes WNL  Plan:  K = 4. Mg = 1.9. No supplementation needed at this time. Will recheck K tomorrow AM and sign off if WNL.  Lenis Noon, Pharm.D., BCPS Clinical Pharmacist 08/08/2017,8:01 AM

## 2017-08-09 LAB — CBC
HEMATOCRIT: 36.4 % (ref 35.0–47.0)
Hemoglobin: 12.6 g/dL (ref 12.0–16.0)
MCH: 30.6 pg (ref 26.0–34.0)
MCHC: 34.5 g/dL (ref 32.0–36.0)
MCV: 88.8 fL (ref 80.0–100.0)
Platelets: 259 10*3/uL (ref 150–440)
RBC: 4.1 MIL/uL (ref 3.80–5.20)
RDW: 14 % (ref 11.5–14.5)
WBC: 25.4 10*3/uL — AB (ref 3.6–11.0)

## 2017-08-09 LAB — BASIC METABOLIC PANEL
ANION GAP: 6 (ref 5–15)
BUN: 19 mg/dL (ref 6–20)
CALCIUM: 9.1 mg/dL (ref 8.9–10.3)
CHLORIDE: 113 mmol/L — AB (ref 101–111)
CO2: 22 mmol/L (ref 22–32)
Creatinine, Ser: 0.74 mg/dL (ref 0.44–1.00)
GFR calc Af Amer: >60 mL/min (ref >60)
GFR calc non Af Amer: >60 mL/min (ref >60)
GLUCOSE: 155 mg/dL — AB (ref 65–99)
Potassium: 4.4 mmol/L (ref 3.5–5.1)
Sodium: 141 mmol/L (ref 135–145)

## 2017-08-09 LAB — PROCALCITONIN: Procalcitonin: <0.1 ng/mL

## 2017-08-09 LAB — HIV ANTIBODY (ROUTINE TESTING W REFLEX): HIV SCREEN 4TH GENERATION: NONREACTIVE

## 2017-08-09 MED ORDER — PREDNISONE 10 MG (21) PO TBPK: ORAL_TABLET | ORAL | 0 refills | Status: DC

## 2017-08-09 NOTE — Discharge Instructions (Signed)

## 2017-08-09 NOTE — Progress Notes (Signed)
Discharge instructions given and went over with patient at bedside. Prescription reviewed. All questions answered. Patient discharged home with friend via wheelchair by nursing staff. Madlyn Frankel, RN

## 2017-08-09 NOTE — Progress Notes (Signed)
MEDICATION RELATED CONSULT NOTE - INITIAL   Pharmacy Consult for electrolyte management Indication: hypokalemia  No Known Allergies  Patient Measurements: Height: 5' (152.4 cm) Weight: 152 lb 6.4 oz (69.1 kg) IBW/kg (Calculated) : 45.5  Vital Signs: Temp: 97.8 F (36.6 C) (10/04 0427) Temp Source: Oral (10/04 0427) BP: 122/68 (10/04 0427) Pulse Rate: 70 (10/04 0427)  Labs:  Recent Labs  08/07/17 1000 08/07/17 2257 08/08/17 0530 08/09/17 0455  WBC 7.4  --  20.8* 25.4*  HGB 13.8  --  12.8 12.6  HCT 40.1  --  37.7 36.4  PLT 256  --  247 259  CREATININE 0.79 0.77 0.64 0.74  MG 1.9  --  1.9  --   PHOS  --   --  2.5  --   ALBUMIN 3.8  --  3.8  --   PROT 7.3  --  7.1  --   AST 22  --  33  --   ALT 17  --  20  --   ALKPHOS 50  --  43  --   BILITOT 0.7  --  0.4  --    Estimated Creatinine Clearance: 64.8 mL/min (by C-G formula based on SCr of 0.74 mg/dL).  Assessment: Pharmacy consulted to monitor and replace electrolytes if needed in this 61 year old female who was hypokalemic on admission.  Goal of Therapy: Electrolytes WNL  Plan:  K = 4.4. No supplementation needed at this time. Electrolytes have been WNL x 3 days. Will sign off.  Lenis Noon, Pharm.D., BCPS Clinical Pharmacist 08/09/2017,7:14 AM

## 2017-08-12 NOTE — Discharge Summary (Signed)
[facility] [place] NAME: Cynthia Fisher    MR#:  [id number]  DATE OF BIRTH:  06/24/1956  DATE OF ADMISSION:  08/07/2017   ADMITTING PHYSICIAN: Nicholes Mango, MD  DATE OF DISCHARGE: 08/09/2017  9:35 AM  PRIMARY CARE PHYSICIAN: Lynnell Jude, MD   ADMISSION DIAGNOSIS:  SOB (shortness of breath) [R06.02] Bronchitis [J40] Reactive airway disease without complication, unspecified asthma severity, unspecified whether persistent [J45.909] DISCHARGE DIAGNOSIS:  Active Problems:   Acute bronchitis  SECONDARY DIAGNOSIS:   Past Medical History:  Diagnosis Date  . Asthma   . Cancer ([place])   . Ear pain   . Hypercholesteremia   . Restless legs   . Seizures [facility])    HOSPITAL COURSE:   Cynthia Gallagheris a 61 y.o. femalewith a known history of Hyperlipidemia, restless leg syndrome presented for shortness of breath.   # Acute resp failure due to Acute COPD exacerbation - improved with steroids and nebs.  # Depression - continue Effexor  #Hyperlipidemia Continue statin  #Hypokalemia Repleted and resolved  DISCHARGE CONDITIONS:  stable CONSULTS OBTAINED:   DRUG ALLERGIES:  No Known Allergies DISCHARGE MEDICATIONS:   Allergies as of 08/09/2017   No Known Allergies     Medication List    TAKE these medications   albuterol 108 (90 Base) MCG/ACT inhaler Commonly known as:  PROVENTIL HFA;VENTOLIN HFA Inhale 1-2 puffs into the lungs every 6 (six) hours as needed for wheezing or shortness of breath.   lubiprostone 8 MCG capsule Commonly known as:  AMITIZA Take 8 mcg by mouth 2 (two) times daily with a meal.   pramipexole 1 MG tablet Commonly known as:  MIRAPEX Take 1 tablet by mouth 2 (two) times daily.   predniSONE 10 MG (21) Tbpk tablet Commonly known as:  STERAPRED UNI-PAK 21 TAB Take 6 tabs by mouth on day 1 then decrease by 1 tablet each day until finished on day 6.   simvastatin 20 MG tablet Commonly  known as:  ZOCOR Take 20 mg by mouth daily.   TOPAMAX 100 MG tablet Generic drug:  topiramate Take 100 mg by mouth 2 (two) times daily.   triamcinolone cream 0.1 % Commonly known as:  KENALOG Apply 1 application topically 2 (two) times daily as needed. To affected areas   venlafaxine XR 150 MG 24 hr capsule Commonly known as:  EFFEXOR-XR Take 150 mg by mouth daily with breakfast.        DISCHARGE INSTRUCTIONS:   DIET:  Regular diet DISCHARGE CONDITION:  Good ACTIVITY:  Activity as tolerated OXYGEN:  Home Oxygen: No.  Oxygen Delivery: room air DISCHARGE LOCATION:  home   If you experience worsening of your admission symptoms, develop shortness of breath, life threatening emergency, suicidal or homicidal thoughts you must seek medical attention immediately by calling 911 or calling your MD immediately  if symptoms less severe.  You Must read complete instructions/literature along with all the possible adverse reactions/side effects for all the Medicines you take and that have been prescribed to you. Take any new Medicines after you have completely understood and accpet all the possible adverse reactions/side effects.   Please note  You were cared for by a hospitalist during your hospital stay. If you have any questions about your discharge medications or the care you received while you were in the hospital after you are discharged, you can call the unit and asked to speak with the hospitalist on call if the hospitalist that  took care of you is not available. Once you are discharged, your primary care physician will handle any further medical issues. Please note that NO REFILLS for any discharge medications will be authorized once you are discharged, as it is imperative that you return to your primary care physician (or establish a relationship with a primary care physician if you do not have one) for your aftercare needs so that they can reassess your need for medications and  monitor your lab values.    On the day of Discharge:  VITAL SIGNS:  Blood pressure (!) 148/70, pulse 76, temperature 97.8 F (36.6 C), temperature source Oral, resp. rate 16, height 5' (1.524 m), weight 69.1 kg (152 lb 6.4 oz), SpO2 98 %. PHYSICAL EXAMINATION:  GENERAL:  61 y.o.-year-old patient lying in the bed with no acute distress.  EYES: Pupils equal, round, reactive to light and accommodation. No scleral icterus. Extraocular muscles intact.  HEENT: Head atraumatic, normocephalic. Oropharynx and nasopharynx clear.  NECK:  Supple, no jugular venous distention. No thyroid enlargement, no tenderness.  LUNGS: Normal breath sounds bilaterally, no wheezing, rales,rhonchi or crepitation. No use of accessory muscles of respiration.  CARDIOVASCULAR: S1, S2 normal. No murmurs, rubs, or gallops.  ABDOMEN: Soft, non-tender, non-distended. Bowel sounds present. No organomegaly or mass.  EXTREMITIES: No pedal edema, cyanosis, or clubbing.  NEUROLOGIC: Cranial nerves II through XII are intact. Muscle strength 5/5 in all extremities. Sensation intact. Gait not checked.  PSYCHIATRIC: The patient is alert and oriented x 3.  SKIN: No obvious rash, lesion, or ulcer.  DATA REVIEW:   CBC  Recent Labs Lab 08/09/17 0455  WBC 25.4*  HGB 12.6  HCT 36.4  PLT 259    Chemistries   Recent Labs Lab 08/08/17 0530 08/09/17 0455  NA 141 141  K 4.0 4.4  CL 116* 113*  CO2 19* 22  GLUCOSE 173* 155*  BUN 12 19  CREATININE 0.64 0.74  CALCIUM 9.1 9.1  MG 1.9  --   AST 33  --   ALT 20  --   ALKPHOS 43  --   BILITOT 0.4  --     Follow-up Information    Lynnell Jude, MD. Schedule an appointment as soon as possible for a visit on 08/16/2017.   Specialty:  Family Medicine Why:  Please keep your follow up appt. with Dr. Clemmie Krill for Thursday October 11th @ 9:00am Contact information: [address] [place] [address] [phone]           Management plans discussed with the patient,  family and they are in agreement.  CODE STATUS: Prior   TOTAL TIME TAKING CARE OF THIS PATIENT: 45 minutes.    Max Sane M.D on 08/12/2017 at 8:57 AM  Between 7am to 6pm - Pager - [phone]  After 6pm go to [email or web address] - Proofreader  [facility]  Office  [phone]  CC: Primary care physician; Lynnell Jude, MD   Note: This dictation was prepared with Dragon dictation along with smaller phrase technology. Any transcriptional errors that result from this process are unintentional.

## 2017-10-27 ENCOUNTER — Ambulatory Visit
Admission: EM | Admit: 2017-10-27 | Discharge: 2017-10-27 | Disposition: A | Discharge status: Home / Self Care | Payer: Medicare Other | Attending: Family Medicine | Admitting: Family Medicine

## 2017-10-27 ENCOUNTER — Other Ambulatory Visit: Payer: Self-pay

## 2017-10-27 DIAGNOSIS — R6889 Other general symptoms and signs: Secondary | ICD-10-CM

## 2017-10-27 DIAGNOSIS — J040 Acute laryngitis: Secondary | ICD-10-CM | POA: Diagnosis not present

## 2017-10-27 DIAGNOSIS — R5383 Other fatigue: Secondary | ICD-10-CM

## 2017-10-27 DIAGNOSIS — R05 Cough: Secondary | ICD-10-CM

## 2017-10-27 DIAGNOSIS — H60313 Diffuse otitis externa, bilateral: Secondary | ICD-10-CM | POA: Diagnosis not present

## 2017-10-27 MED ORDER — NEOMYCIN-POLYMYXIN-HC 3.5-10000-1 OT SUSP: 4.0000 [drp] | Freq: Three times a day (TID) | OTIC | 0 refills | Status: DC

## 2017-10-27 MED ORDER — OSELTAMIVIR PHOSPHATE 75 MG PO CAPS: 75.0000 mg | ORAL_CAPSULE | Freq: Two times a day (BID) | ORAL | 0 refills | Status: DC

## 2017-10-27 MED ORDER — BENZONATATE 200 MG PO CAPS: ORAL_CAPSULE | ORAL | 0 refills | Status: DC

## 2017-10-27 MED ORDER — FLUTICASONE PROPIONATE 50 MCG/ACT NA SUSP: 2.0000 | Freq: Every day | NASAL | 0 refills | Status: DC

## 2017-10-27 NOTE — Discharge Instructions (Signed)
Take ibuprofen 200 mg +500 mg of Tylenol together every 6 hours body aches and fever.  If you worsen recommend following up in the emergency room.

## 2017-10-27 NOTE — ED Triage Notes (Addendum)
Pt with bilateral ear pain and pressure, head and face pressure, cough,  laryngitis and stridor.

## 2017-10-27 NOTE — ED Provider Notes (Signed)
[facility]    CSN: [id number] Arrival date & time: 10/27/17  1323     History   Chief Complaint Chief Complaint  Patient presents with  . Otalgia  . Cough  . Laryngitis    HPI Cynthia Fisher is a 61 y.o. female.   HPI  Is a 61 year old female who presents with body aches bilateral ear pain and pressure head and face pressure cough laryngitis and stridor.  Sudden onset of the symptoms 2 days ago.  Today she is afebrile O2 sats on room air 99%.  She does have a history of asthma.  She has been using her inhalers.  Patient looks ill though not toxic.        Past Medical History:  Diagnosis Date  . Asthma   . Cancer ([place])   . Ear pain   . Hypercholesteremia   . Restless legs   . Seizures [facility])     Patient Active Problem List   Diagnosis Date Noted  . Acute bronchitis 08/07/2017    Past Surgical History:  Procedure Laterality Date  . ABDOMINAL HYSTERECTOMY    . INNER EAR SURGERY Right   . NASAL SINUS SURGERY      OB History    No data available       Home Medications    Prior to Admission medications   Medication Sig Start Date End Date Taking? Authorizing Provider  albuterol (PROVENTIL HFA;VENTOLIN HFA) 108 (90 BASE) MCG/ACT inhaler Inhale 1-2 puffs into the lungs every 6 (six) hours as needed for wheezing or shortness of breath. 08/14/15   Betancourt, Aura Fey, NP  benzonatate (TESSALON) 200 MG capsule Take one cap TID PRN cough 10/27/17   Crecencio Mc P, PA-C  fluticasone [facility]) 50 MCG/ACT nasal spray Place 2 sprays into both nostrils daily. 10/27/17   Lorin Picket, PA-C  lubiprostone (AMITIZA) 8 MCG capsule Take 8 mcg by mouth 2 (two) times daily with a meal.    [provider]  oseltamivir (TAMIFLU) 75 MG capsule Take 1 capsule (75 mg total) by mouth every 12 (twelve) hours. 10/27/17   Lorin Picket, PA-C  simvastatin (ZOCOR) 20 MG tablet Take 20 mg by mouth daily.    [provider]  topiramate  (TOPAMAX) 100 MG tablet Take 100 mg by mouth 2 (two) times daily.    [provider]  triamcinolone cream (KENALOG) 0.1 % Apply 1 application topically 2 (two) times daily as needed. To affected areas Patient not taking: Reported on 08/07/2017 04/15/17   Katy Apo, NP  venlafaxine XR (EFFEXOR-XR) 150 MG 24 hr capsule Take 150 mg by mouth daily with breakfast.    [provider]    Family History Family History  Problem Relation Age of Onset  . COPD Mother   . Heart disease Father   . Hypertension Father   . Diabetes Father   . Cancer Father     Social History Social History   Tobacco Use  . Smoking status: Never Smoker  . Smokeless tobacco: Never Used  Substance Use Topics  . Alcohol use: No  . Drug use: No     Allergies   Patient has no known allergies.   Review of Systems Review of Systems  Constitutional: Positive for activity change, appetite change, chills and fatigue.  HENT: Positive for congestion, ear pain, postnasal drip, rhinorrhea, sinus pressure, sinus pain and voice change.   Respiratory: Positive for cough, shortness of breath and stridor.  All other systems reviewed and are negative.    Physical Exam Triage Vital Signs ED Triage Vitals  Enc Vitals Group     BP 10/27/17 1426 (!) 158/92     Pulse Rate 10/27/17 1426 93     Resp 10/27/17 1426 18     Temp 10/27/17 1426 98.1 F (36.7 C)     Temp Source 10/27/17 1426 Oral     SpO2 10/27/17 1426 99 %     Weight 10/27/17 1426 150 lb (68 kg)     Height 10/27/17 1426 5' (1.524 m)     Head Circumference --      Peak Flow --      Pain Score 10/27/17 1432 9     Pain Loc --      Pain Edu? --      Excl. in [place]? --    No data found.  Updated Vital Signs BP (!) 158/92 (BP Location: Left Arm)   Pulse 93   Temp 98.1 F (36.7 C) (Oral)   Resp 18   Ht 5' (1.524 m)   Wt 150 lb (68 kg)   SpO2 99%   BMI 29.29 kg/m   Visual Acuity Right Eye Distance:   Left Eye Distance:     Bilateral Distance:    Right Eye Near:   Left Eye Near:    Bilateral Near:     Physical Exam  Constitutional: She is oriented to person, place, and time. She appears well-developed and well-nourished. No distress.  HENT:  Head: Normocephalic.  Nose: Nose normal.  Mouth/Throat: Oropharynx is clear and moist. No oropharyngeal exudate.  She has significant pain with movement of the tragus bilaterally.  Right ear canal is swollen mild glimpse of the TM appearing dull.  Left ear canal is mildly swollen much as the right.  TM also appears dull  Eyes: Pupils are equal, round, and reactive to light. Right eye exhibits no discharge. Left eye exhibits no discharge.  Neck: Normal range of motion. Neck supple.  Pulmonary/Chest: Effort normal and breath sounds normal. No stridor. No respiratory distress. She has no wheezes. She has no rales.  Musculoskeletal: Normal range of motion.  Lymphadenopathy:    She has no cervical adenopathy.  Neurological: She is alert and oriented to person, place, and time.  Skin: Skin is warm and dry. She is not diaphoretic.  Psychiatric: She has a normal mood and affect. Her behavior is normal. Judgment and thought content normal.  Nursing note and vitals reviewed.    UC Treatments / Results  Labs (all labs ordered are listed, but only abnormal results are displayed) Labs Reviewed - No data to display  EKG  EKG Interpretation None       Radiology No results found.  Procedures Procedures (including critical care time)  Medications Ordered in UC Medications - No data to display   Initial Impression / Assessment and Plan / UC Course  I have reviewed the triage vital signs and the nursing notes.  Pertinent labs & imaging results that were available during my care of the patient were reviewed by me and considered in my medical decision making (see chart for details).     Plan: 1. Test/x-ray results and diagnosis reviewed with patient 2. rx as  per orders; risks, benefits, potential side effects reviewed with patient 3. Recommend supportive treatment with rest and fluids.  Recommend using ibuprofen and Tylenol together every 6 hours for body aches and fever.  Will treat with Tamiflu and  Tessalon Perles.  If she worsens I have recommended following up in the emergency room. 4. F/u prn if symptoms worsen or don't improve   Final Clinical Impressions(s) / UC Diagnoses   Final diagnoses:  Acute diffuse otitis externa of both ears  Flu-like symptoms    ED Discharge Orders        Ordered    benzonatate (TESSALON) 200 MG capsule     10/27/17 1535    oseltamivir (TAMIFLU) 75 MG capsule  Every 12 hours     10/27/17 1535    fluticasone (FLONASE) 50 MCG/ACT nasal spray  Daily     10/27/17 1535       Controlled Substance Prescriptions [place] Controlled Substance Registry consulted? Not Applicable   Lorin Picket, PA-C 10/27/17 1544

## 2017-10-31 ENCOUNTER — Other Ambulatory Visit: Payer: Self-pay

## 2017-10-31 ENCOUNTER — Emergency Department: Payer: Medicare Other

## 2017-10-31 ENCOUNTER — Emergency Department
Admission: EM | Admit: 2017-10-31 | Discharge: 2017-10-31 | Disposition: A | Discharge status: Home / Self Care | Payer: Medicare Other | Attending: Emergency Medicine | Admitting: Emergency Medicine

## 2017-10-31 DIAGNOSIS — J069 Acute upper respiratory infection, unspecified: Secondary | ICD-10-CM | POA: Diagnosis not present

## 2017-10-31 DIAGNOSIS — J45901 Unspecified asthma with (acute) exacerbation: Secondary | ICD-10-CM | POA: Diagnosis not present

## 2017-10-31 DIAGNOSIS — R0602 Shortness of breath: Secondary | ICD-10-CM | POA: Diagnosis present

## 2017-10-31 LAB — CBC WITH DIFFERENTIAL/PLATELET
BASOS PCT: 0 %
Basophils Absolute: 0 10*3/uL (ref 0–0.1)
EOS ABS: 0.2 10*3/uL (ref 0–0.7)
Eosinophils Relative: 2 %
HEMATOCRIT: 42 % (ref 35.0–47.0)
HEMOGLOBIN: 13.9 g/dL (ref 12.0–16.0)
Lymphocytes Relative: 13 %
Lymphs Abs: 1.4 10*3/uL (ref 1.0–3.6)
MCH: 29.3 pg (ref 26.0–34.0)
MCHC: 33.1 g/dL (ref 32.0–36.0)
MCV: 88.5 fL (ref 80.0–100.0)
Monocytes Absolute: 0.8 10*3/uL (ref 0.2–0.9)
Monocytes Relative: 8 %
NEUTROS ABS: 8.3 10*3/uL — AB (ref 1.4–6.5)
NEUTROS PCT: 77 %
Platelets: 261 10*3/uL (ref 150–440)
RBC: 4.75 MIL/uL (ref 3.80–5.20)
RDW: 13.4 % (ref 11.5–14.5)
WBC: 10.8 10*3/uL (ref 3.6–11.0)

## 2017-10-31 LAB — BASIC METABOLIC PANEL
Anion gap: 8 (ref 5–15)
BUN: 17 mg/dL (ref 6–20)
CHLORIDE: 103 mmol/L (ref 101–111)
CO2: 26 mmol/L (ref 22–32)
CREATININE: 0.86 mg/dL (ref 0.44–1.00)
Calcium: 8.8 mg/dL — ABNORMAL LOW (ref 8.9–10.3)
Glucose, Bld: 100 mg/dL — ABNORMAL HIGH (ref 65–99)
Potassium: 3.4 mmol/L — ABNORMAL LOW (ref 3.5–5.1)
SODIUM: 137 mmol/L (ref 135–145)

## 2017-10-31 LAB — BLOOD GAS, VENOUS
Acid-Base Excess: 2.9 mmol/L — ABNORMAL HIGH (ref 0.0–2.0)
Bicarbonate: 29 mmol/L — ABNORMAL HIGH (ref 20.0–28.0)
PATIENT TEMPERATURE: 37
PCO2 VEN: 49 mmHg (ref 44.0–60.0)
pH, Ven: 7.38 (ref 7.250–7.430)
pO2, Ven: <31 mmHg — CL (ref 32.0–45.0)

## 2017-10-31 LAB — TROPONIN I

## 2017-10-31 LAB — BRAIN NATRIURETIC PEPTIDE: B NATRIURETIC PEPTIDE 5: 16 pg/mL (ref 0.0–100.0)

## 2017-10-31 LAB — INFLUENZA PANEL BY PCR (TYPE A & B)
Influenza A By PCR: NEGATIVE
Influenza B By PCR: NEGATIVE

## 2017-10-31 MED ORDER — PREDNISONE 10 MG (21) PO TBPK: ORAL_TABLET | Freq: Every day | ORAL | 0 refills | Status: DC

## 2017-10-31 MED ORDER — METHYLPREDNISOLONE SODIUM SUCC 125 MG IJ SOLR
125.0000 mg | Freq: Once | INTRAMUSCULAR | Status: AC
  Administered 2017-10-31: 125 mg via INTRAVENOUS
  Filled 2017-10-31: qty 2

## 2017-10-31 MED ORDER — HYDROCOD POLST-CPM POLST ER 10-8 MG/5ML PO SUER
5.0000 mL | Freq: Once | ORAL | Status: AC
  Administered 2017-10-31: 5 mL via ORAL
  Filled 2017-10-31: qty 5

## 2017-10-31 MED ORDER — HYDROCOD POLST-CPM POLST ER 10-8 MG/5ML PO SUER: 5.0000 mL | Freq: Two times a day (BID) | ORAL | 0 refills | Status: DC

## 2017-10-31 NOTE — ED Notes (Signed)
Pt called; IV was not taken out prior to discharge; Pt advised to go to fire station to have it removed.

## 2017-10-31 NOTE — ED Provider Notes (Signed)
[facility] Emergency Department Provider Note       Time seen: ----------------------------------------- 12:29 PM on 10/31/2017 -----------------------------------------   I have reviewed the triage vital signs and the nursing notes.  HISTORY   Chief Complaint Shortness of Breath    HPI Cynthia Fisher is a 61 y.o. female with a history of asthma and hypercholesterolemia who presents to the ED for shortness of breath and difficulty breathing.  Patient states she was diagnosed with the flu 3 days ago and has history of asthma.  She has given herself multiple breathing treatments and EMS gave additional breathing treatments in route without significant improvement in her symptoms.  Reportedly she had a temperature of 100.0.  She arrives alert and oriented.  Past Medical History:  Diagnosis Date  . Asthma   . Cancer ([place])   . Ear pain   . Hypercholesteremia   . Restless legs   . Seizures [facility])     Patient Active Problem List   Diagnosis Date Noted  . Acute bronchitis 08/07/2017    Past Surgical History:  Procedure Laterality Date  . ABDOMINAL HYSTERECTOMY    . INNER EAR SURGERY Right   . NASAL SINUS SURGERY      Allergies Patient has no known allergies.  Social History Social History   Tobacco Use  . Smoking status: Never Smoker  . Smokeless tobacco: Never Used  Substance Use Topics  . Alcohol use: No  . Drug use: No    Review of Systems Constitutional: Negative for fever. Cardiovascular: Negative for chest pain. Respiratory: Positive for shortness of breath and cough Gastrointestinal: Negative for abdominal pain, vomiting and diarrhea. Genitourinary: Negative for dysuria. Musculoskeletal: Negative for back pain. Skin: Negative for rash. Neurological: Negative for headaches, focal weakness or numbness.  All systems negative/normal/unremarkable except as stated in the  HPI  ____________________________________________   PHYSICAL EXAM:  VITAL SIGNS: ED Triage Vitals  Enc Vitals Group     BP      Pulse      Resp      Temp      Temp src      SpO2      Weight      Height      Head Circumference      Peak Flow      Pain Score      Pain Loc      Pain Edu?      Excl. in [place]?     Constitutional: Alert and oriented.  Mild distress Eyes: Conjunctivae are normal. Normal extraocular movements. ENT   Head: Normocephalic and atraumatic.   Nose: No congestion/rhinnorhea.   Mouth/Throat: Mucous membranes are moist.   Neck: No stridor. Cardiovascular: Rapid rate, regular rhythm. No murmurs, rubs, or gallops. Respiratory: Positive for tachypnea, prolonged expiration and wheezing bilaterally Gastrointestinal: Soft and nontender. Normal bowel sounds Musculoskeletal: Nontender with normal range of motion in extremities. No lower extremity tenderness nor edema. Neurologic:  Normal speech and language. No gross focal neurologic deficits are appreciated.  Skin:  Skin is warm, dry and intact. No rash noted. Psychiatric: Depressed mood and affect ____________________________________________  EKG: Interpreted by me.  Sinus tachycardia with a rate of 121 bpm, left axis deviation, possible septal infarct age-indeterminate, ST depression is noted  ____________________________________________  ED COURSE:  As part of my medical decision making, I reviewed the following data within the [place] History obtained from family if available, nursing notes, old chart and ekg, as  well as notes from prior ED visits. Patient presented for shortness of breath and cough, we will assess with labs and imaging as indicated at this time.   Procedures ____________________________________________   LABS (pertinent positives/negatives)  Labs Reviewed  BLOOD GAS, VENOUS - Abnormal; Notable for the following components:      Result Value   pO2,  Ven <31.0 (*)    Bicarbonate 29.0 (*)    Acid-Base Excess 2.9 (*)    All other components within normal limits  CBC WITH DIFFERENTIAL/PLATELET - Abnormal; Notable for the following components:   Neutro Abs 8.3 (*)    All other components within normal limits  BASIC METABOLIC PANEL - Abnormal; Notable for the following components:   Potassium 3.4 (*)    Glucose, Bld 100 (*)    Calcium 8.8 (*)    All other components within normal limits  BRAIN NATRIURETIC PEPTIDE  TROPONIN I  INFLUENZA PANEL BY PCR (TYPE A & B)    RADIOLOGY Images were viewed by me  Chest x-ray  IMPRESSION: There is no pneumonia nor other acute cardiopulmonary abnormality. ____________________________________________  DIFFERENTIAL DIAGNOSIS   Asthma exacerbation, pneumonia, influenza, viral infection, PE, pneumothorax  FINAL ASSESSMENT AND PLAN  Asthma exacerbation, URI   Plan: Patient had presented for shortness of breath and cough. Patient's labs are reassuring here. Patient's imaging did not reveal any acute process.  She is currently feeling better, he still has some expiratory wheezing but overall this appears to be a viral infection which has exacerbated her asthma.  Her room air saturations are 96% right we would likely be able to discharge her with steroids and cough medications.  She is stable for outpatient follow-up and agrees with plan.   Earleen Newport, MD   Note: This note was generated in part or whole with voice recognition software. Voice recognition is usually quite accurate but there are transcription errors that can and very often do occur. I apologize for any typographical errors that were not detected and corrected.     Earleen Newport, MD 10/31/17 [phone]

## 2017-10-31 NOTE — ED Triage Notes (Signed)
Pt arrived via EMS from home d/t SOB and difficulty breathing. Pt was diagnosed with the flu 3 days ago and has a hx of asthma. Pt self administered 5 of albuterol and EMS gave 2.5 in route. EMS reports pt had a temperature of 100.0 and admin 200 of ibuprofen. Pt is A&O x4 at this time.

## 2017-12-18 ENCOUNTER — Emergency Department: Payer: Medicare Other

## 2017-12-18 ENCOUNTER — Other Ambulatory Visit: Payer: Self-pay

## 2017-12-18 ENCOUNTER — Ambulatory Visit
Admission: EM | Admit: 2017-12-18 | Discharge: 2017-12-18 | Disposition: A | Discharge status: Other Acute Inpatient Hospital | Payer: Medicare Other | Source: Home / Self Care | Attending: Family Medicine | Admitting: Family Medicine

## 2017-12-18 ENCOUNTER — Emergency Department
Admission: EM | Admit: 2017-12-18 | Discharge: 2017-12-18 | Disposition: A | Discharge status: Home / Self Care | Payer: Medicare Other | Attending: Emergency Medicine | Admitting: Emergency Medicine

## 2017-12-18 ENCOUNTER — Encounter: Payer: Self-pay | Admitting: Emergency Medicine

## 2017-12-18 DIAGNOSIS — H538 Other visual disturbances: Secondary | ICD-10-CM | POA: Diagnosis not present

## 2017-12-18 DIAGNOSIS — S060X1A Concussion with loss of consciousness of 30 minutes or less, initial encounter: Secondary | ICD-10-CM

## 2017-12-18 DIAGNOSIS — S0181XA Laceration without foreign body of other part of head, initial encounter: Secondary | ICD-10-CM

## 2017-12-18 DIAGNOSIS — Y999 Unspecified external cause status: Secondary | ICD-10-CM | POA: Insufficient documentation

## 2017-12-18 DIAGNOSIS — S0990XA Unspecified injury of head, initial encounter: Secondary | ICD-10-CM

## 2017-12-18 DIAGNOSIS — Z23 Encounter for immunization: Secondary | ICD-10-CM | POA: Diagnosis not present

## 2017-12-18 DIAGNOSIS — Y9389 Activity, other specified: Secondary | ICD-10-CM | POA: Insufficient documentation

## 2017-12-18 DIAGNOSIS — Z87898 Personal history of other specified conditions: Secondary | ICD-10-CM

## 2017-12-18 DIAGNOSIS — Y929 Unspecified place or not applicable: Secondary | ICD-10-CM | POA: Insufficient documentation

## 2017-12-18 DIAGNOSIS — W108XXA Fall (on) (from) other stairs and steps, initial encounter: Secondary | ICD-10-CM

## 2017-12-18 DIAGNOSIS — J45909 Unspecified asthma, uncomplicated: Secondary | ICD-10-CM | POA: Diagnosis not present

## 2017-12-18 DIAGNOSIS — W109XXA Fall (on) (from) unspecified stairs and steps, initial encounter: Secondary | ICD-10-CM | POA: Insufficient documentation

## 2017-12-18 DIAGNOSIS — S0083XA Contusion of other part of head, initial encounter: Secondary | ICD-10-CM | POA: Diagnosis not present

## 2017-12-18 MED ORDER — OXYCODONE-ACETAMINOPHEN 5-325 MG PO TABS
2.0000 | ORAL_TABLET | Freq: Once | ORAL | Status: AC
  Administered 2017-12-18: 2 via ORAL
  Filled 2017-12-18: qty 2

## 2017-12-18 MED ORDER — OXYCODONE-ACETAMINOPHEN 5-325 MG PO TABS: 2.0000 | ORAL_TABLET | Freq: Four times a day (QID) | ORAL | 0 refills | Status: DC | PRN

## 2017-12-18 MED ORDER — TETANUS-DIPHTH-ACELL PERTUSSIS 5-2.5-18.5 LF-MCG/0.5 IM SUSP
0.5000 mL | Freq: Once | INTRAMUSCULAR | Status: AC
  Administered 2017-12-18: 0.5 mL via INTRAMUSCULAR

## 2017-12-18 MED ORDER — ONDANSETRON 4 MG PO TBDP
4.0000 mg | ORAL_TABLET | Freq: Once | ORAL | Status: AC
Start: 2017-12-18 — End: 2017-12-18
  Administered 2017-12-18: 4 mg via ORAL
  Filled 2017-12-18: qty 1

## 2017-12-18 NOTE — ED Notes (Signed)
Pt was taken to CT via w/c

## 2017-12-18 NOTE — ED Provider Notes (Signed)
[facility] Emergency Department Provider Note       Time seen: ----------------------------------------- 8:55 PM on 12/18/2017 -----------------------------------------   I have reviewed the triage vital signs and the nursing notes.  HISTORY   Chief Complaint Fall    HPI Cynthia Fisher is a 62 y.o. female with a history of asthma, cancer, hyperlipidemia and seizures who presents to the ED for a head injury.  Patient states she was moving a piece of furniture and fell down 2 stairs striking her head.  She briefly lost consciousness and may have subsequently had a seizure.  She states she has a history of seizure disorders.  Currently she has a left-sided headache as well as some nausea.  Pain is 10 out of 10 in her head at this time.  Past Medical History:  Diagnosis Date  . Asthma   . Cancer ([place])   . Ear pain   . Hypercholesteremia   . Restless legs   . Seizures [facility])     Patient Active Problem List   Diagnosis Date Noted  . Acute bronchitis 08/07/2017    Past Surgical History:  Procedure Laterality Date  . ABDOMINAL HYSTERECTOMY    . CHOLECYSTECTOMY    . INNER EAR SURGERY Right   . NASAL SINUS SURGERY      Allergies Patient has no known allergies.  Social History Social History   Tobacco Use  . Smoking status: Never Smoker  . Smokeless tobacco: Never Used  Substance Use Topics  . Alcohol use: No  . Drug use: No    Review of Systems Constitutional: Negative for fever. Cardiovascular: Negative for chest pain. Respiratory: Negative for shortness of breath. Gastrointestinal: Negative for abdominal pain, positive for nausea Genitourinary: Negative for dysuria. Musculoskeletal: Negative for back pain. Skin: Positive for scalp hematoma Neurological: Positive for headache  All systems negative/normal/unremarkable except as stated in the HPI  ____________________________________________   PHYSICAL EXAM:  VITAL SIGNS: ED  Triage Vitals  Enc Vitals Group     BP 12/18/17 1836 (!) 158/78     Pulse Rate 12/18/17 1836 99     Resp 12/18/17 1836 18     Temp 12/18/17 1836 98 F (36.7 C)     Temp Source 12/18/17 1836 Oral     SpO2 12/18/17 1836 99 %     Weight 12/18/17 1832 160 lb (72.6 kg)     Height 12/18/17 1832 5\' 2"  (1.575 m)     Head Circumference --      Peak Flow --      Pain Score 12/18/17 1832 9     Pain Loc --      Pain Edu? --      Excl. in [employer]? --     Constitutional: Alert and oriented. Well appearing and in no distress. Eyes: Conjunctivae are normal. Normal extraocular movements. ENT   Head: Normocephalic, left frontal scalp hematoma.   Nose: No congestion/rhinnorhea.   Mouth/Throat: Mucous membranes are moist.   Neck: No stridor. Cardiovascular: Normal rate, regular rhythm. No murmurs, rubs, or gallops. Respiratory: Normal respiratory effort without tachypnea nor retractions. Breath sounds are clear and equal bilaterally. No wheezes/rales/rhonchi. Musculoskeletal: Nontender with normal range of motion in extremities. No lower extremity tenderness nor edema. Neurologic:  Normal speech and language. No gross focal neurologic deficits are appreciated.  Skin: Left frontal scalp hematoma with 1.5 cm superficial laceration located within this hematoma Psychiatric: Mood and affect are normal. Speech and behavior are normal.  ____________________________________________  ED  COURSE:  As part of my medical decision making, I reviewed the following data within the [place] History obtained from family if available, nursing notes, old chart and ekg, as well as notes from prior ED visits. Patient presented for head injury, we will assess with imaging as indicated at this time.   Procedures ____________________________________________   RADIOLOGY Images were viewed by me  CT head and C-spine were grossly unremarkable other than superficial  hematoma  ____________________________________________  DIFFERENTIAL DIAGNOSIS   Concussion, skull fracture, subdural, subarachnoid, C-spine fracture  FINAL ASSESSMENT AND PLAN  Concussion, head injury   Plan: Patient had presented for a head injury with subsequent concussion symptoms. Patient's imaging is unremarkable other than for superficial injury.  She is stable for outpatient follow-up.   Laurence Aly, MD   Note: This note was generated in part or whole with voice recognition software. Voice recognition is usually quite accurate but there are transcription errors that can and very often do occur. I apologize for any typographical errors that were not detected and corrected.     Earleen Newport, MD 12/18/17 [phone]

## 2017-12-18 NOTE — ED Triage Notes (Signed)
States she was moving a piece of furniture and fell down 2 stairs  Hit her head  Poss LOC for about 1 min  Abrasion noted to left side of forehead  With swelling

## 2017-12-18 NOTE — ED Provider Notes (Signed)
[facility]    CSN: [id number] Arrival date & time: 12/18/17  1622     History   Chief Complaint Chief Complaint  Patient presents with  . Head Injury    HPI Cynthia Fisher is a 62 y.o. female.   62 yo female with a c/o headache and left eye blurred vision after injuring her head. States she fell down a couple of stairs about 2 hours ago hitting her head. States she lost consciousness for "a couple of minutes". Patient also has a h/o seizure disorder. Denies any numbness/tingling, one-sided weakness, vomiting, disorientation.   The history is provided by the patient.  Head Injury    Past Medical History:  Diagnosis Date  . Asthma   . Cancer ([place])   . Ear pain   . Hypercholesteremia   . Restless legs   . Seizures [facility])     Patient Active Problem List   Diagnosis Date Noted  . Acute bronchitis 08/07/2017    Past Surgical History:  Procedure Laterality Date  . ABDOMINAL HYSTERECTOMY    . CHOLECYSTECTOMY    . INNER EAR SURGERY Right   . NASAL SINUS SURGERY      OB History    No data available       Home Medications    Prior to Admission medications   Medication Sig Start Date End Date Taking? Authorizing Provider  albuterol (PROVENTIL HFA;VENTOLIN HFA) 108 (90 BASE) MCG/ACT inhaler Inhale 1-2 puffs into the lungs every 6 (six) hours as needed for wheezing or shortness of breath. 08/14/15   Betancourt, Aura Fey, NP  benzonatate (TESSALON) 200 MG capsule Take one cap TID PRN cough 10/27/17   Lorin Picket, PA-C  chlorpheniramine-HYDROcodone [facility]) 10-8 MG/5ML SUER Take 5 mLs by mouth 2 (two) times daily. 10/31/17   Earleen Newport, MD  fluticasone (FLONASE) 50 MCG/ACT nasal spray Place 2 sprays into both nostrils daily. 10/27/17   Lorin Picket, PA-C  lubiprostone (AMITIZA) 8 MCG capsule Take 8 mcg by mouth 2 (two) times daily with a meal.    [provider]  neomycin-polymyxin-hydrocortisone (CORTISPORIN)  3.5-10000-1 OTIC suspension Place 4 drops into both ears 3 (three) times daily. Patient not taking: Reported on 10/31/2017 10/27/17   Lorin Picket, PA-C  oseltamivir (TAMIFLU) 75 MG capsule Take 1 capsule (75 mg total) by mouth every 12 (twelve) hours. 10/27/17   Lorin Picket, PA-C  pramipexole (MIRAPEX) 1 MG tablet Take 1 mg by mouth 2 (two) times daily. 09/25/17   [provider]  predniSONE (STERAPRED UNI-PAK 21 TAB) 10 MG (21) TBPK tablet Take by mouth daily. Dispense steroid taper pack as directed 10/31/17   Earleen Newport, MD  simvastatin (ZOCOR) 20 MG tablet Take 20 mg by mouth daily.    [provider]  topiramate (TOPAMAX) 100 MG tablet Take 100 mg by mouth 2 (two) times daily.    [provider]  triamcinolone cream (KENALOG) 0.1 % Apply 1 application topically 2 (two) times daily as needed. To affected areas Patient not taking: Reported on 08/07/2017 04/15/17   Katy Apo, NP  venlafaxine XR (EFFEXOR-XR) 150 MG 24 hr capsule Take 150 mg by mouth daily with breakfast.    [provider]    Family History Family History  Problem Relation Age of Onset  . COPD Mother   . Heart disease Father   . Hypertension Father   . Diabetes Father   . Cancer Father  Social History Social History   Tobacco Use  . Smoking status: Never Smoker  . Smokeless tobacco: Never Used  Substance Use Topics  . Alcohol use: No  . Drug use: No     Allergies   Patient has no known allergies.   Review of Systems Review of Systems   Physical Exam Triage Vital Signs ED Triage Vitals [12/18/17 1635]  Enc Vitals Group     BP (!) 165/80     Pulse Rate (!) 102     Resp 18     Temp 98.6 F (37 C)     Temp Source Oral     SpO2 99 %     Weight 160 lb (72.6 kg)     Height 5' (1.524 m)     Head Circumference      Peak Flow      Pain Score 8     Pain Loc      Pain Edu?      Excl. in [place]?    No data found.  Updated Vital Signs BP  (!) 165/80 (BP Location: Left Arm)   Pulse (!) 102   Temp 98.6 F (37 C) (Oral)   Resp 18   Ht 5' (1.524 m)   Wt 160 lb (72.6 kg)   SpO2 99%   BMI 31.25 kg/m   Visual Acuity Right Eye Distance:   Left Eye Distance:   Bilateral Distance:    Right Eye Near:   Left Eye Near:    Bilateral Near:     Physical Exam  Constitutional: She is oriented to person, place, and time. She appears well-developed and well-nourished. No distress.  HENT:  Head: Normocephalic.    Left forehead hematoma and superficial laceration approx 1.5cm  Eyes: EOM are normal. Pupils are equal, round, and reactive to light.  Neck: Normal range of motion. Neck supple. No tracheal deviation present. No thyromegaly present.  Musculoskeletal: She exhibits no edema.  Lymphadenopathy:    She has no cervical adenopathy.  Neurological: She is alert and oriented to person, place, and time. She has normal reflexes. She displays normal reflexes. No cranial nerve deficit or sensory deficit. She exhibits normal muscle tone. Coordination normal.  Skin: She is not diaphoretic.  Nursing note and vitals reviewed.    UC Treatments / Results  Labs (all labs ordered are listed, but only abnormal results are displayed) Labs Reviewed - No data to display  EKG  EKG Interpretation None       Radiology No results found.  Procedures Procedures (including critical care time)  Medications Ordered in UC Medications  Tdap (BOOSTRIX) injection 0.5 mL (0.5 mLs Intramuscular Given 12/18/17 1643)     Initial Impression / Assessment and Plan / UC Course  I have reviewed the triage vital signs and the nursing notes.  Pertinent labs & imaging results that were available during my care of the patient were reviewed by me and considered in my medical decision making (see chart for details).       Final Clinical Impressions(s) / UC Diagnoses   Final diagnoses:  Closed head injury, initial encounter  Concussion with  loss of consciousness of 30 minutes or less, initial encounter  Blurred vision, left eye  H/O idiopathic seizure    ED Discharge Orders    None     Due to injury with loss of consciousness and current vision symptoms, recommend patient go to Emergency Department for further evaluation and management.   Controlled Substance Prescriptions  [place] Controlled Substance Registry consulted? Not Applicable   Norval Gable, MD 12/18/17 [phone]

## 2017-12-18 NOTE — ED Triage Notes (Signed)
Pt reports she was moving a piece of furniture and fell down two stairs and hit her head (she thinks on the cabinet she was moving). She endorses LOC for about a minute. Left forehead abrasion and hematoma. Headache pain 8/10 and left eye blurry. A&O x 4

## 2017-12-18 NOTE — ED Notes (Signed)
Wound cleansed with NS and Hibiclens

## 2017-12-18 NOTE — Discharge Instructions (Signed)
Recommend patient go to Emergency Department for further evaluation and management °

## 2018-05-30 ENCOUNTER — Ambulatory Visit
Admission: EM | Admit: 2018-05-30 | Discharge: 2018-05-30 | Disposition: A | Discharge status: Home / Self Care | Payer: Medicare Other | Attending: Family Medicine | Admitting: Family Medicine

## 2018-05-30 ENCOUNTER — Encounter: Payer: Self-pay | Admitting: Emergency Medicine

## 2018-05-30 ENCOUNTER — Other Ambulatory Visit: Payer: Self-pay

## 2018-05-30 DIAGNOSIS — L219 Seborrheic dermatitis, unspecified: Secondary | ICD-10-CM

## 2018-05-30 DIAGNOSIS — L210 Seborrhea capitis: Secondary | ICD-10-CM | POA: Diagnosis not present

## 2018-05-30 DIAGNOSIS — R21 Rash and other nonspecific skin eruption: Secondary | ICD-10-CM | POA: Diagnosis not present

## 2018-05-30 MED ORDER — KETOCONAZOLE 2 % EX SHAM: 1.0000 "application " | MEDICATED_SHAMPOO | CUTANEOUS | 0 refills | Status: AC

## 2018-05-30 MED ORDER — TRIAMCINOLONE ACETONIDE 0.1 % EX CREA: 1.0000 "application " | TOPICAL_CREAM | Freq: Two times a day (BID) | CUTANEOUS | 0 refills | Status: DC

## 2018-05-30 MED ORDER — KETOCONAZOLE 2 % EX CREA: 1.0000 "application " | TOPICAL_CREAM | Freq: Every day | CUTANEOUS | 0 refills | Status: AC

## 2018-05-30 NOTE — ED Triage Notes (Signed)
Pt here c/o rash on her scalp, back of her neck, behind her ears, between her fingers, and bottoms of her feet. Started 3 days ago. She has not changed shampoo, lotions, soaps etc. She has tried multiple topical home remedies with no relief and has taken benadryl. She is currently taken a prednisone taper for her back. She is on the 2nd day. She has taken this in the past with no problems. She is also taking flexeril, she has taken it in the past but has been a while but does not remember any problems.

## 2018-05-30 NOTE — ED Provider Notes (Signed)
[facility]    CSN: [id number] Arrival date & time: 05/30/18  1906     History   Chief Complaint Chief Complaint  Patient presents with  . Rash    HPI Cynthia Fisher is a 62 y.o. female.   HPI  62 year old female presents today with a 3 day history of a rash on her scalp ,back of her neck, behind her ears and actually on the ear itself.  She has itching between her fingers on the bottoms of her feet but no rash.  Has not changed any shampoos lotions or soaps.  Applying Benadryl cream which has not help her.  Rash is itchy and flaking.  She is taking a prednisone taper at the present time being on the second day for a low back problem.  Past history of asthma        Past Medical History:  Diagnosis Date  . Asthma   . Cancer ([place])   . Ear pain   . Hypercholesteremia   . Restless legs   . Seizures [facility])     Patient Active Problem List   Diagnosis Date Noted  . Acute bronchitis 08/07/2017    Past Surgical History:  Procedure Laterality Date  . ABDOMINAL HYSTERECTOMY    . CHOLECYSTECTOMY    . INNER EAR SURGERY Right   . NASAL SINUS SURGERY      OB History   None      Home Medications    Prior to Admission medications   Medication Sig Start Date End Date Taking? Authorizing Provider  albuterol (PROVENTIL HFA;VENTOLIN HFA) 108 (90 BASE) MCG/ACT inhaler Inhale 1-2 puffs into the lungs every 6 (six) hours as needed for wheezing or shortness of breath. 08/14/15  Yes Betancourt, Aura Fey, NP  lubiprostone (AMITIZA) 8 MCG capsule Take 8 mcg by mouth 2 (two) times daily with a meal.   Yes [provider]  simvastatin (ZOCOR) 20 MG tablet Take 20 mg by mouth daily.   Yes [provider]  venlafaxine XR (EFFEXOR-XR) 150 MG 24 hr capsule Take 150 mg by mouth daily with breakfast.   Yes [provider]  cyclobenzaprine (FLEXERIL) 5 MG tablet cyclobenzaprine 5 mg tablet  Take 1 tablet every day by oral route at bedtime.     [provider]  fluticasone (FLONASE) 50 MCG/ACT nasal spray Place 2 sprays into both nostrils daily. 10/27/17   Lorin Picket, PA-C  ketoconazole (NIZORAL) 2 % cream Apply 1 application topically daily. Applied to ears and neck 05/30/18   Lorin Picket, PA-C  ketoconazole (NIZORAL) 2 % shampoo Apply 1 application topically 2 (two) times a week. 05/30/18   Lorin Picket, PA-C  predniSONE (DELTASONE) 10 MG tablet prednisone 10 mg tablet  TAKE 3 TABLETS BY MOUTH EVERY DAY    [provider]  topiramate (TOPAMAX) 100 MG tablet Take 100 mg by mouth 2 (two) times daily.    [provider]  triamcinolone cream (KENALOG) 0.1 % Apply 1 application topically 2 (two) times daily. Apply to hands and feet 05/30/18   Lorin Picket, PA-C    Family History Family History  Problem Relation Age of Onset  . COPD Mother   . Heart disease Father   . Hypertension Father   . Diabetes Father   . Cancer Father     Social History Social History   Tobacco Use  . Smoking status: Never Smoker  . Smokeless tobacco: Never Used  Substance Use Topics  .  Alcohol use: No  . Drug use: No     Allergies   Patient has no known allergies.   Review of Systems Review of Systems  Constitutional: Negative for activity change, appetite change, chills, fatigue and fever.  Skin: Positive for rash.  All other systems reviewed and are negative.    Physical Exam Triage Vital Signs ED Triage Vitals  Enc Vitals Group     BP 05/30/18 1916 (!) 146/100     Pulse Rate 05/30/18 1916 99     Resp 05/30/18 1916 17     Temp 05/30/18 1916 98.2 F (36.8 C)     Temp Source 05/30/18 1916 Oral     SpO2 05/30/18 1916 99 %     Weight 05/30/18 1916 160 lb (72.6 kg)     Height 05/30/18 1916 5' (1.524 m)     Head Circumference --      Peak Flow --      Pain Score 05/30/18 1915 6     Pain Loc --      Pain Edu? --      Excl. in [place]? --    No data found.  Updated Vital Signs BP (!)  146/100 (BP Location: Left Arm)   Pulse 99   Temp 98.2 F (36.8 C) (Oral)   Resp 17   Ht 5' (1.524 m)   Wt 160 lb (72.6 kg)   SpO2 99%   BMI 31.25 kg/m   Visual Acuity Right Eye Distance:   Left Eye Distance:   Bilateral Distance:    Right Eye Near:   Left Eye Near:    Bilateral Near:     Physical Exam  Constitutional: She is oriented to person, place, and time. She appears well-developed and well-nourished. No distress.  HENT:  Head: Normocephalic.  Eyes: Pupils are equal, round, and reactive to light. Right eye exhibits no discharge. Left eye exhibits no discharge.  Neck: Normal range of motion.  Musculoskeletal: Normal range of motion.  Examination of the scalp shows  greasy white dry macules and papules with excoriations in the scalp and on the neck also on the the ears.  Crusts are sticky.  Interdigital hands and also plantar surface of the feet do not have a rash but the patient reports itchiness.  Excoriations are not seen.  Neurological: She is alert and oriented to person, place, and time.  Skin: Skin is warm and dry. Rash noted. She is not diaphoretic.  Psychiatric: She has a normal mood and affect. Her behavior is normal. Judgment and thought content normal.  Nursing note and vitals reviewed.    UC Treatments / Results  Labs (all labs ordered are listed, but only abnormal results are displayed) Labs Reviewed - No data to display  EKG None  Radiology No results found.  Procedures Procedures (including critical care time)  Medications Ordered in UC Medications - No data to display  Initial Impression / Assessment and Plan / UC Course  I have reviewed the triage vital signs and the nursing notes.  Pertinent labs & imaging results that were available during my care of the patient were reviewed by me and considered in my medical decision making (see chart for details).     Plan: 1. Test/x-ray results and diagnosis reviewed with patient 2. rx as per  orders; risks, benefits, potential side effects reviewed with patient 3. Recommend supportive treatment with ketoconazole shampoo and cream.  I will also treat the itching of the fingers and the feet  with some triamcinolone cream.  Recommend following up with dermatologist if she is not improving.  To continue taking her prednisone to completion for her low back. 4. F/u prn if symptoms worsen or don't improve  Final Clinical Impressions(s) / UC Diagnoses   Final diagnoses:  Seborrheic dermatitis of scalp   Discharge Instructions   None    ED Prescriptions    Medication Sig Dispense Auth. Provider   ketoconazole (NIZORAL) 2 % shampoo Apply 1 application topically 2 (two) times a week. 120 mL Crecencio Mc P, PA-C   triamcinolone cream (KENALOG) 0.1 % Apply 1 application topically 2 (two) times daily. Apply to hands and feet 30 g Crecencio Mc P, PA-C   ketoconazole (NIZORAL) 2 % cream Apply 1 application topically daily. Applied to ears and neck 15 g Lorin Picket, PA-C     Controlled Substance Prescriptions [place] Controlled Substance Registry consulted? Not Applicable   Lorin Picket, PA-C 05/30/18 2012

## 2018-06-01 ENCOUNTER — Encounter: Payer: Self-pay | Admitting: Gynecology

## 2018-06-01 ENCOUNTER — Ambulatory Visit
Admission: EM | Admit: 2018-06-01 | Discharge: 2018-06-01 | Disposition: A | Discharge status: Home / Self Care | Payer: Medicare Other | Attending: Family Medicine | Admitting: Family Medicine

## 2018-06-01 DIAGNOSIS — H6021 Malignant otitis externa, right ear: Secondary | ICD-10-CM

## 2018-06-01 DIAGNOSIS — H6011 Cellulitis of right external ear: Secondary | ICD-10-CM

## 2018-06-01 MED ORDER — NEOMYCIN-POLYMYXIN-HC 3.5-10000-1 OT SUSP: 4.0000 [drp] | Freq: Three times a day (TID) | OTIC | 0 refills | Status: DC

## 2018-06-01 MED ORDER — SULFAMETHOXAZOLE-TRIMETHOPRIM 800-160 MG PO TABS: 1.0000 | ORAL_TABLET | Freq: Two times a day (BID) | ORAL | 0 refills | Status: DC

## 2018-06-01 NOTE — Discharge Instructions (Signed)
Warm compresses.

## 2018-06-01 NOTE — ED Triage Notes (Signed)
Patient c/o right ear pain x last pm. Per patient ear pain cause her to vomit twice x last pm.

## 2018-06-01 NOTE — ED Provider Notes (Signed)
[facility]    CSN: [id number] Arrival date & time: 06/01/18  1116     History   Chief Complaint Chief Complaint  Patient presents with  . Otalgia    HPI Cynthia Fisher is a 62 y.o. female.   62 yo female with a c/o 2-3 days of right ear pain, swelling of earlobe and drainage from the ear canal. Denies any fevers, chills or injuries.   The history is provided by the patient.  Otalgia    Past Medical History:  Diagnosis Date  . Asthma   . Cancer ([place])   . Ear pain   . Hypercholesteremia   . Restless legs   . Seizures [facility])     Patient Active Problem List   Diagnosis Date Noted  . Acute bronchitis 08/07/2017    Past Surgical History:  Procedure Laterality Date  . ABDOMINAL HYSTERECTOMY    . CHOLECYSTECTOMY    . INNER EAR SURGERY Right   . NASAL SINUS SURGERY      OB History   None      Home Medications    Prior to Admission medications   Medication Sig Start Date End Date Taking? Authorizing Provider  albuterol (PROVENTIL HFA;VENTOLIN HFA) 108 (90 BASE) MCG/ACT inhaler Inhale 1-2 puffs into the lungs every 6 (six) hours as needed for wheezing or shortness of breath. 08/14/15  Yes Betancourt, Aura Fey, NP  cyclobenzaprine (FLEXERIL) 5 MG tablet cyclobenzaprine 5 mg tablet  Take 1 tablet every day by oral route at bedtime.   Yes [provider]  fluticasone (FLONASE) 50 MCG/ACT nasal spray Place 2 sprays into both nostrils daily. 10/27/17  Yes Lorin Picket, PA-C  ketoconazole (NIZORAL) 2 % cream Apply 1 application topically daily. Applied to ears and neck 05/30/18  Yes Crecencio Mc P, PA-C  lubiprostone (AMITIZA) 8 MCG capsule Take 8 mcg by mouth 2 (two) times daily with a meal.   Yes [provider]  predniSONE (DELTASONE) 10 MG tablet prednisone 10 mg tablet  TAKE 3 TABLETS BY MOUTH EVERY DAY   Yes [provider]  simvastatin (ZOCOR) 20 MG tablet Take 20 mg by mouth daily.   Yes [provider]    topiramate (TOPAMAX) 100 MG tablet Take 100 mg by mouth 2 (two) times daily.   Yes [provider]  triamcinolone cream (KENALOG) 0.1 % Apply 1 application topically 2 (two) times daily. Apply to hands and feet 05/30/18  Yes Lorin Picket, PA-C  ketoconazole (NIZORAL) 2 % shampoo Apply 1 application topically 2 (two) times a week. 05/30/18   Lorin Picket, PA-C  neomycin-polymyxin-hydrocortisone (CORTISPORIN) 3.5-10000-1 OTIC suspension Place 4 drops into the right ear 3 (three) times daily. 06/01/18   Norval Gable, MD  sulfamethoxazole-trimethoprim (BACTRIM DS,SEPTRA DS) 800-160 MG tablet Take 1 tablet by mouth 2 (two) times daily. 06/01/18   Norval Gable, MD  venlafaxine XR (EFFEXOR-XR) 150 MG 24 hr capsule Take 150 mg by mouth daily with breakfast.    [provider]    Family History Family History  Problem Relation Age of Onset  . COPD Mother   . Heart disease Father   . Hypertension Father   . Diabetes Father   . Cancer Father     Social History Social History   Tobacco Use  . Smoking status: Never Smoker  . Smokeless tobacco: Never Used  Substance Use Topics  . Alcohol use: No  . Drug use: No     Allergies  Patient has no known allergies.   Review of Systems Review of Systems  HENT: Positive for ear pain.      Physical Exam Triage Vital Signs ED Triage Vitals  Enc Vitals Group     BP 06/01/18 1137 (!) 160/100     Pulse Rate 06/01/18 1137 91     Resp 06/01/18 1137 16     Temp 06/01/18 1137 97.7 F (36.5 C)     Temp Source 06/01/18 1137 Oral     SpO2 06/01/18 1137 100 %     Weight 06/01/18 1138 160 lb (72.6 kg)     Height --      Head Circumference --      Peak Flow --      Pain Score 06/01/18 1139 10     Pain Loc --      Pain Edu? --      Excl. in [place]? --    No data found.  Updated Vital Signs BP (!) 160/100   Pulse 91   Temp 97.7 F (36.5 C) (Oral)   Resp 16   Wt 160 lb (72.6 kg)   SpO2 100%   BMI 31.25 kg/m    Visual Acuity Right Eye Distance:   Left Eye Distance:   Bilateral Distance:    Right Eye Near:   Left Eye Near:    Bilateral Near:     Physical Exam  Constitutional: She appears well-developed and well-nourished. No distress.  HENT:  Right Ear: There is drainage and swelling.  Right ear canal with edema, erythema and drainage; external earlobe skin with warmth, blanchable erythema and tenderness to palpation  Skin: She is not diaphoretic.  Nursing note and vitals reviewed.    UC Treatments / Results  Labs (all labs ordered are listed, but only abnormal results are displayed) Labs Reviewed - No data to display  EKG None  Radiology No results found.  Procedures Procedures (including critical care time)  Medications Ordered in UC Medications - No data to display  Initial Impression / Assessment and Plan / UC Course  I have reviewed the triage vital signs and the nursing notes.  Pertinent labs & imaging results that were available during my care of the patient were reviewed by me and considered in my medical decision making (see chart for details).      Final Clinical Impressions(s) / UC Diagnoses   Final diagnoses:  Acute malignant otitis externa of right ear  Cellulitis of right earlobe     Discharge Instructions     Warm compresses    ED Prescriptions    Medication Sig Dispense Auth. Provider   neomycin-polymyxin-hydrocortisone (CORTISPORIN) 3.5-10000-1 OTIC suspension Place 4 drops into the right ear 3 (three) times daily. 10 mL Norval Gable, MD   sulfamethoxazole-trimethoprim (BACTRIM DS,SEPTRA DS) 800-160 MG tablet Take 1 tablet by mouth 2 (two) times daily. 20 tablet Norval Gable, MD     1. diagnosis reviewed with patient 2. rx as per orders above; reviewed possible side effects, interactions, risks and benefits  3. Recommend supportive treatment with otc analgesics, warm compresses  4. Follow-up prn if symptoms worsen or don't  improve   Controlled Substance Prescriptions [place] Controlled Substance Registry consulted? Not Applicable   Norval Gable, MD 06/01/18 1245

## 2018-12-02 ENCOUNTER — Ambulatory Visit
Admission: EM | Admit: 2018-12-02 | Discharge: 2018-12-02 | Disposition: A | Discharge status: Home / Self Care | Payer: Medicare Other | Attending: Family Medicine | Admitting: Family Medicine

## 2018-12-02 ENCOUNTER — Other Ambulatory Visit: Payer: Self-pay

## 2018-12-02 ENCOUNTER — Ambulatory Visit: Payer: Medicare Other

## 2018-12-02 DIAGNOSIS — J4521 Mild intermittent asthma with (acute) exacerbation: Secondary | ICD-10-CM | POA: Insufficient documentation

## 2018-12-02 MED ORDER — IPRATROPIUM-ALBUTEROL 0.5-2.5 (3) MG/3ML IN SOLN
3.0000 mL | Freq: Once | RESPIRATORY_TRACT | Status: AC
  Administered 2018-12-02: 3 mL via RESPIRATORY_TRACT

## 2018-12-02 MED ORDER — METHYLPREDNISOLONE SODIUM SUCC 125 MG IJ SOLR
125.0000 mg | Freq: Once | INTRAMUSCULAR | Status: AC
  Administered 2018-12-02: 125 mg via INTRAMUSCULAR

## 2018-12-02 MED ORDER — BENZONATATE 200 MG PO CAPS: 200.0000 mg | ORAL_CAPSULE | Freq: Three times a day (TID) | ORAL | 0 refills | Status: DC | PRN

## 2018-12-02 MED ORDER — IPRATROPIUM-ALBUTEROL 0.5-2.5 (3) MG/3ML IN SOLN: 3.0000 mL | Freq: Once | RESPIRATORY_TRACT | Status: DC

## 2018-12-02 MED ORDER — PREDNISONE 20 MG PO TABS: ORAL_TABLET | ORAL | 0 refills | Status: DC

## 2018-12-02 MED ORDER — ALBUTEROL SULFATE HFA 108 (90 BASE) MCG/ACT IN AERS: 1.0000 | INHALATION_SPRAY | Freq: Four times a day (QID) | RESPIRATORY_TRACT | 0 refills | Status: DC | PRN

## 2018-12-02 NOTE — ED Provider Notes (Signed)
[facility]    CSN: [id number] Arrival date & time: 12/02/18  1344     History   Chief Complaint Chief Complaint  Patient presents with  . Shortness of Breath    HPI Cynthia Fisher is a 63 y.o. female.   The history is provided by the patient.  Shortness of Breath  Severity:  Moderate Onset quality:  Sudden Duration:  12 hours Timing:  Constant Progression:  Unchanged Chronicity:  New Context: URI (recently)   Relieved by:  Nothing Worsened by:  Exertion Ineffective treatments:  Inhaler Associated symptoms: cough and sputum production     Past Medical History:  Diagnosis Date  . Asthma   . Cancer ([place])   . Ear pain   . Hypercholesteremia   . Restless legs   . Seizures [facility])     Patient Active Problem List   Diagnosis Date Noted  . Acute bronchitis 08/07/2017    Past Surgical History:  Procedure Laterality Date  . ABDOMINAL HYSTERECTOMY    . CHOLECYSTECTOMY    . INNER EAR SURGERY Right   . NASAL SINUS SURGERY      OB History   No obstetric history on file.      Home Medications    Prior to Admission medications   Medication Sig Start Date End Date Taking? Authorizing Provider  amphetamine-dextroamphetamine (ADDERALL) 20 MG tablet Take 20 mg by mouth daily.   Yes [provider]  lubiprostone (AMITIZA) 8 MCG capsule Take 8 mcg by mouth 2 (two) times daily with a meal.   Yes [provider]  neomycin-polymyxin-hydrocortisone (CORTISPORIN) 3.5-10000-1 OTIC suspension Place 4 drops into the right ear 3 (three) times daily. 06/01/18  Yes Norval Gable, MD  simvastatin (ZOCOR) 20 MG tablet Take 20 mg by mouth daily.   Yes [provider]  topiramate (TOPAMAX) 100 MG tablet Take 100 mg by mouth 2 (two) times daily.   Yes [provider]  venlafaxine XR (EFFEXOR-XR) 150 MG 24 hr capsule Take 150 mg by mouth daily with breakfast.   Yes [provider]  albuterol (PROVENTIL HFA;VENTOLIN HFA) 108  (90 Base) MCG/ACT inhaler Inhale 1-2 puffs into the lungs every 6 (six) hours as needed for wheezing or shortness of breath. 12/02/18   Norval Gable, MD  benzonatate (TESSALON) 200 MG capsule Take 1 capsule (200 mg total) by mouth 3 (three) times daily as needed. 12/02/18   Norval Gable, MD  cyclobenzaprine (FLEXERIL) 5 MG tablet cyclobenzaprine 5 mg tablet  Take 1 tablet every day by oral route at bedtime.    [provider]  fluticasone (FLONASE) 50 MCG/ACT nasal spray Place 2 sprays into both nostrils daily. 10/27/17   Lorin Picket, PA-C  ketoconazole (NIZORAL) 2 % cream Apply 1 application topically daily. Applied to ears and neck 05/30/18   Lorin Picket, PA-C  ketoconazole (NIZORAL) 2 % shampoo Apply 1 application topically 2 (two) times a week. 05/30/18   Lorin Picket, PA-C  predniSONE (DELTASONE) 20 MG tablet 3 tabs po qd x 2 days, then 2 tabs po qd x 2 days, then 1 tab po qd x 2 days, then half a tab po qd x 2 days 12/02/18   Norval Gable, MD  sulfamethoxazole-trimethoprim (BACTRIM DS,SEPTRA DS) 800-160 MG tablet Take 1 tablet by mouth 2 (two) times daily. 06/01/18   Norval Gable, MD  triamcinolone cream (KENALOG) 0.1 % Apply 1 application topically 2 (two) times daily. Apply to hands and feet 05/30/18  Lorin Picket, PA-C    Family History Family History  Problem Relation Age of Onset  . COPD Mother   . Heart disease Father   . Hypertension Father   . Diabetes Father   . Cancer Father     Social History Social History   Tobacco Use  . Smoking status: Never Smoker  . Smokeless tobacco: Never Used  Substance Use Topics  . Alcohol use: No  . Drug use: No     Allergies   Patient has no known allergies.   Review of Systems Review of Systems  Respiratory: Positive for cough, sputum production and shortness of breath.      Physical Exam Triage Vital Signs ED Triage Vitals  Enc Vitals Group     BP 12/02/18 1400 140/82     Pulse Rate  12/02/18 1400 87     Resp 12/02/18 1400 (!) 22     Temp 12/02/18 1400 98.1 F (36.7 C)     Temp Source 12/02/18 1400 Oral     SpO2 12/02/18 1400 99 %     Weight 12/02/18 1400 140 lb (63.5 kg)     Height 12/02/18 1400 5' (1.524 m)     Head Circumference --      Peak Flow --      Pain Score 12/02/18 1359 9     Pain Loc --      Pain Edu? --      Excl. in [place]? --    No data found.  Updated Vital Signs BP 140/82 (BP Location: Left Arm)   Pulse 87   Temp 98.1 F (36.7 C) (Oral)   Resp (!) 22   Ht 5' (1.524 m)   Wt 63.5 kg   SpO2 99%   BMI 27.34 kg/m   Visual Acuity Right Eye Distance:   Left Eye Distance:   Bilateral Distance:    Right Eye Near:   Left Eye Near:    Bilateral Near:     Physical Exam Vitals signs and nursing note reviewed.  Constitutional:      General: She is not in acute distress.    Appearance: Normal appearance. She is well-developed. She is not toxic-appearing or diaphoretic.  HENT:     Head: Normocephalic and atraumatic.     Nose: Mucosal edema present. No nasal deformity, septal deviation or laceration.     Right Sinus: Maxillary sinus tenderness and frontal sinus tenderness present.     Left Sinus: Maxillary sinus tenderness and frontal sinus tenderness present.     Mouth/Throat:     Pharynx: Uvula midline. No oropharyngeal exudate.  Eyes:     General: No scleral icterus.       Right eye: No discharge.        Left eye: No discharge.  Neck:     Musculoskeletal: Normal range of motion and neck supple.     Thyroid: No thyromegaly.  Cardiovascular:     Rate and Rhythm: Regular rhythm. Tachycardia present.     Heart sounds: Normal heart sounds.  Pulmonary:     Effort: Pulmonary effort is normal. No respiratory distress.     Breath sounds: No stridor. Wheezing and rhonchi present. No rales.  Lymphadenopathy:     Cervical: No cervical adenopathy.  Neurological:     Mental Status: She is alert.      UC Treatments / Results  Labs (all  labs ordered are listed, but only abnormal results are displayed) Labs Reviewed - No data to  display  EKG None  Radiology Dg Chest 2 View  Result Date: 12/02/2018 CLINICAL DATA:  63 year old female with a history shortness of breath EXAM: CHEST - 2 VIEW COMPARISON:  10/31/2017, 08/07/2017, 01/24/2017 FINDINGS: Cardiomediastinal silhouette unchanged in size and contour. Unchanged position of generator on the left chest wall compatible with vagal nerve stimulator. No evidence of central vascular congestion. No interlobular septal thickening. No pneumothorax or pleural effusion. No confluent airspace disease. Degenerative changes of the spine with no acute displaced fracture. IMPRESSION: Negative for acute cardiopulmonary disease Electronically Signed   By: Corrie Mckusick D.O.   On: 12/02/2018 15:18    Procedures ED EKG Date/Time: 12/02/2018 2:48 PM Performed by: Norval Gable, MD Authorized by: Norval Gable, MD   ECG reviewed by ED Physician in the absence of a cardiologist: yes   Previous ECG:    Previous ECG:  Compared to current   Similarity:  No change Interpretation:    Interpretation: abnormal   Rate:    ECG rate:  107   ECG rate assessment: tachycardic   Rhythm:    Rhythm: sinus tachycardia   Ectopy:    Ectopy: none   QRS:    QRS axis:  Normal Conduction:    Conduction: normal   ST segments:    ST segments:  Normal T waves:    T waves: normal     (including critical care time)  Medications Ordered in UC Medications  ipratropium-albuterol (DUONEB) 0.5-2.5 (3) MG/3ML nebulizer solution 3 mL (3 mLs Nebulization Given 12/02/18 1418)  methylPREDNISolone sodium succinate (SOLU-MEDROL) 125 mg/2 mL injection 125 mg (125 mg Intramuscular Given 12/02/18 1422)  ipratropium-albuterol (DUONEB) 0.5-2.5 (3) MG/3ML nebulizer solution 3 mL (3 mLs Nebulization Given 12/02/18 1433)    Initial Impression / Assessment and Plan / UC Course  I have reviewed the triage vital signs and  the nursing notes.  Pertinent labs & imaging results that were available during my care of the patient were reviewed by me and considered in my medical decision making (see chart for details).      Final Clinical Impressions(s) / UC Diagnoses   Final diagnoses:  Intermittent asthma with acute exacerbation, unspecified asthma severity    ED Prescriptions    Medication Sig Dispense Auth. Provider   predniSONE (DELTASONE) 20 MG tablet 3 tabs po qd x 2 days, then 2 tabs po qd x 2 days, then 1 tab po qd x 2 days, then half a tab po qd x 2 days 13 tablet Eino Whitner, MD   albuterol (PROVENTIL HFA;VENTOLIN HFA) 108 (90 Base) MCG/ACT inhaler Inhale 1-2 puffs into the lungs every 6 (six) hours as needed for wheezing or shortness of breath. 1 Inhaler Larayah Clute, Linward Foster, MD   benzonatate (TESSALON) 200 MG capsule Take 1 capsule (200 mg total) by mouth 3 (three) times daily as needed. 30 capsule Norval Gable, MD      1. x-ray results and diagnosis reviewed with patient 2. Given duoneb x 2 plus solumedrol 125mg  IM x1 with improvement of symptoms 3. rx as per orders above; reviewed possible side effects, interactions, risks and benefits  3. Recommend supportive treatment with rest, fluids 4. Follow-up prn if symptoms worsen or don't improve   Controlled Substance Prescriptions [place] Controlled Substance Registry consulted? Not Applicable   Norval Gable, MD 12/02/18 [phone]

## 2018-12-02 NOTE — ED Triage Notes (Signed)
Patient complains of shortness of breath and cough that started last night. States that she used her nebulizers and inhalers without relief.

## 2018-12-02 NOTE — Discharge Instructions (Addendum)
Rest, fluids. 

## 2019-11-10 ENCOUNTER — Other Ambulatory Visit: Payer: Self-pay

## 2019-11-10 ENCOUNTER — Emergency Department: Payer: Medicare Other

## 2019-11-10 ENCOUNTER — Ambulatory Visit (INDEPENDENT_AMBULATORY_CARE_PROVIDER_SITE_OTHER)
Admission: EM | Admit: 2019-11-10 | Discharge: 2019-11-10 | Disposition: A | Discharge status: Home / Self Care | Payer: Medicare Other | Source: Home / Self Care

## 2019-11-10 ENCOUNTER — Emergency Department
Admission: EM | Admit: 2019-11-10 | Discharge: 2019-11-10 | Disposition: A | Discharge status: Home / Self Care | Payer: Medicare Other | Attending: Emergency Medicine | Admitting: Emergency Medicine

## 2019-11-10 DIAGNOSIS — R202 Paresthesia of skin: Secondary | ICD-10-CM | POA: Diagnosis not present

## 2019-11-10 DIAGNOSIS — Z859 Personal history of malignant neoplasm, unspecified: Secondary | ICD-10-CM | POA: Insufficient documentation

## 2019-11-10 DIAGNOSIS — Z20822 Contact with and (suspected) exposure to covid-19: Secondary | ICD-10-CM | POA: Insufficient documentation

## 2019-11-10 DIAGNOSIS — R079 Chest pain, unspecified: Secondary | ICD-10-CM

## 2019-11-10 DIAGNOSIS — R0602 Shortness of breath: Secondary | ICD-10-CM

## 2019-11-10 DIAGNOSIS — J45909 Unspecified asthma, uncomplicated: Secondary | ICD-10-CM | POA: Diagnosis not present

## 2019-11-10 DIAGNOSIS — R200 Anesthesia of skin: Secondary | ICD-10-CM | POA: Insufficient documentation

## 2019-11-10 DIAGNOSIS — R519 Headache, unspecified: Secondary | ICD-10-CM

## 2019-11-10 DIAGNOSIS — M79602 Pain in left arm: Secondary | ICD-10-CM

## 2019-11-10 DIAGNOSIS — Z79899 Other long term (current) drug therapy: Secondary | ICD-10-CM | POA: Insufficient documentation

## 2019-11-10 DIAGNOSIS — R0789 Other chest pain: Secondary | ICD-10-CM | POA: Diagnosis present

## 2019-11-10 LAB — CBC
HCT: 38.5 % (ref 36.0–46.0)
Hemoglobin: 13.2 g/dL (ref 12.0–15.0)
MCH: 29.7 pg (ref 26.0–34.0)
MCHC: 34.3 g/dL (ref 30.0–36.0)
MCV: 86.7 fL (ref 80.0–100.0)
Platelets: 231 10*3/uL (ref 150–400)
RBC: 4.44 MIL/uL (ref 3.87–5.11)
RDW: 12.2 % (ref 11.5–15.5)
WBC: 7.5 10*3/uL (ref 4.0–10.5)
nRBC: 0 % (ref 0.0–0.2)

## 2019-11-10 LAB — TROPONIN I (HIGH SENSITIVITY)
Troponin I (High Sensitivity): <2 ng/L (ref <18)
Troponin I (High Sensitivity): <2 ng/L (ref <18)

## 2019-11-10 LAB — BASIC METABOLIC PANEL
Anion gap: 8 (ref 5–15)
BUN: 17 mg/dL (ref 8–23)
CO2: 26 mmol/L (ref 22–32)
Calcium: 9 mg/dL (ref 8.9–10.3)
Chloride: 105 mmol/L (ref 98–111)
Creatinine, Ser: 0.47 mg/dL (ref 0.44–1.00)
GFR calc Af Amer: >60 mL/min (ref >60)
GFR calc non Af Amer: >60 mL/min (ref >60)
Glucose, Bld: 117 mg/dL — ABNORMAL HIGH (ref 70–99)
Potassium: 3.8 mmol/L (ref 3.5–5.1)
Sodium: 139 mmol/L (ref 135–145)

## 2019-11-10 LAB — GLUCOSE, CAPILLARY: Glucose-Capillary: 104 mg/dL — ABNORMAL HIGH (ref 70–99)

## 2019-11-10 MED ORDER — SODIUM CHLORIDE 0.9% FLUSH: 3.0000 mL | Freq: Once | INTRAVENOUS | Status: DC

## 2019-11-10 MED ORDER — ASPIRIN 81 MG PO CHEW
324.0000 mg | CHEWABLE_TABLET | Freq: Once | ORAL | Status: AC
  Administered 2019-11-10: 324 mg via ORAL

## 2019-11-10 NOTE — ED Notes (Signed)
Pt alert and calmly sitting in bed. Currently talking on phone with someone.

## 2019-11-10 NOTE — Discharge Instructions (Addendum)
Go to ED immediately via EMS for further evaluation and treatment of your symptoms.

## 2019-11-10 NOTE — ED Provider Notes (Signed)
[facility] Emergency Department Provider Note  ____________________________________________  Time seen: Approximately 4:02 PM  I have reviewed the triage vital signs and the nursing notes.   HISTORY  Chief Complaint Chest Pain    HPI Cynthia Fisher is a 64 y.o. female who presents the emergency department with multiple complaints.  Patient was seen in urgent care today with complaint of left-sided chest pain and left arm pain.  Patient states that symptoms began yesterday roughly 6:00 which is 18 hours prior to my assessment.  Patient states that she started to have pain in the left side of the chest, then began to have pain in the left arm.  Patient states that symptoms persisted to this morning.  This morning she started to have slight headache, slight blurred vision, she describes weakness to her left arm and left lower extremity as well as numbness to the left arm and left lower extremity.  Patient denies any fevers or chills, neck pain or stiffness, shortness of breath, cough, abdominal pain, nausea or vomiting at this time.  Patient has no cardiac history.  No history of CVA or TIA in the past.  Patient was sent to the emergency department for further evaluation.    Per the patient, she reports that her family members insisted that she see "someone" as she had been acting "different" and possibly slurring words.  Patient states that she does not feel like she is slurring.  Patient has a history of asthma, cancer, hypercholesterolemia, seizures.  Patient denies any complaints of chronic medical issues.        Past Medical History:  Diagnosis Date  . Asthma   . Cancer ([place])   . Ear pain   . Hypercholesteremia   . Restless legs   . Seizures [facility])     Patient Active Problem List   Diagnosis Date Noted  . Acute bronchitis 08/07/2017    Past Surgical History:  Procedure Laterality Date  . ABDOMINAL HYSTERECTOMY    . CHOLECYSTECTOMY    . INNER  EAR SURGERY Right   . NASAL SINUS SURGERY      Prior to Admission medications   Medication Sig Start Date End Date Taking? Authorizing Provider  albuterol (PROVENTIL HFA;VENTOLIN HFA) 108 (90 Base) MCG/ACT inhaler Inhale 1-2 puffs into the lungs every 6 (six) hours as needed for wheezing or shortness of breath. 12/02/18   Norval Gable, MD  amphetamine-dextroamphetamine (ADDERALL) 20 MG tablet Take 20 mg by mouth daily.    [provider]  benzonatate (TESSALON) 200 MG capsule Take 1 capsule (200 mg total) by mouth 3 (three) times daily as needed. 12/02/18   Norval Gable, MD  cyclobenzaprine (FLEXERIL) 5 MG tablet cyclobenzaprine 5 mg tablet  Take 1 tablet every day by oral route at bedtime.    [provider]  fluticasone (FLONASE) 50 MCG/ACT nasal spray Place 2 sprays into both nostrils daily. 10/27/17   Lorin Picket, PA-C  ketoconazole (NIZORAL) 2 % cream Apply 1 application topically daily. Applied to ears and neck 05/30/18   Lorin Picket, PA-C  ketoconazole (NIZORAL) 2 % shampoo Apply 1 application topically 2 (two) times a week. 05/30/18   Lorin Picket, PA-C  lubiprostone (AMITIZA) 8 MCG capsule Take 8 mcg by mouth 2 (two) times daily with a meal.    [provider]  neomycin-polymyxin-hydrocortisone (CORTISPORIN) 3.5-10000-1 OTIC suspension Place 4 drops into the right ear 3 (three) times daily. 06/01/18   Norval Gable, MD  predniSONE (  DELTASONE) 20 MG tablet 3 tabs po qd x 2 days, then 2 tabs po qd x 2 days, then 1 tab po qd x 2 days, then half a tab po qd x 2 days 12/02/18   Norval Gable, MD  simvastatin (ZOCOR) 20 MG tablet Take 20 mg by mouth daily.    [provider]  sulfamethoxazole-trimethoprim (BACTRIM DS,SEPTRA DS) 800-160 MG tablet Take 1 tablet by mouth 2 (two) times daily. 06/01/18   Norval Gable, MD  topiramate (TOPAMAX) 100 MG tablet Take 100 mg by mouth 2 (two) times daily.    [provider]  triamcinolone cream  (KENALOG) 0.1 % Apply 1 application topically 2 (two) times daily. Apply to hands and feet 05/30/18   Lorin Picket, PA-C  venlafaxine XR (EFFEXOR-XR) 150 MG 24 hr capsule Take 150 mg by mouth daily with breakfast.    [provider]    Allergies Patient has no known allergies.  Family History  Problem Relation Age of Onset  . COPD Mother   . Heart disease Father   . Hypertension Father   . Diabetes Father   . Cancer Father     Social History Social History   Tobacco Use  . Smoking status: Never Smoker  . Smokeless tobacco: Never Used  Substance Use Topics  . Alcohol use: No  . Drug use: No     Review of Systems  Constitutional: No fever/chills Eyes: Bilateral blurred vision.   No discharge ENT: No upper respiratory complaints. Cardiovascular: Left-sided chest pain. Respiratory: no cough. No SOB. Gastrointestinal: No abdominal pain.  No nausea, no vomiting.  No diarrhea.  No constipation. Genitourinary: Negative for dysuria. No hematuria Musculoskeletal: Negative for musculoskeletal pain. Skin: Negative for rash, abrasions, lacerations, ecchymosis. Neurological: Negative for headaches, positive for left-sided weakness and numbness.. 10-point ROS otherwise negative.  ____________________________________________   PHYSICAL EXAM:  VITAL SIGNS: ED Triage Vitals  Enc Vitals Group     BP 11/10/19 1321 (!) 147/73     Pulse Rate 11/10/19 1321 80     Resp 11/10/19 1321 18     Temp 11/10/19 1321 98.1 F (36.7 C)     Temp Source 11/10/19 1321 Oral     SpO2 11/10/19 1321 100 %     Weight --      Height --      Head Circumference --      Peak Flow --      Pain Score 11/10/19 1218 8     Pain Loc --      Pain Edu? --      Excl. in [place]? --      Constitutional: Alert and oriented. Well appearing and in no acute distress. Eyes: Conjunctivae are normal. PERRL. EOMI. Head: Atraumatic. ENT:      Ears:       Nose: No congestion/rhinnorhea.       Mouth/Throat: Mucous membranes are moist.  Neck: No stridor.  Neck is supple full range of motion Hematological/Lymphatic/Immunilogical: No cervical lymphadenopathy. Cardiovascular: Normal rate, regular rhythm. Normal S1 and S2.  Good peripheral circulation. Respiratory: Normal respiratory effort without tachypnea or retractions. Lungs CTAB. Good air entry to the bases with no decreased or absent breath sounds. Musculoskeletal: Full range of motion to all extremities. No gross deformities appreciated. Neurologic:  Normal speech and language. No gross focal neurologic deficits are appreciated.  Cranial nerves II through XII grossly intact.  Negative pronator drift, however patient does have weakness of the left upper extremity when  compared with right.  Romberg's intact, however patient does have weakness of the left lower extremity when compared with right. Skin:  Skin is warm, dry and intact. No rash noted. Psychiatric: Mood and affect are normal. Speech and behavior are normal. Patient exhibits appropriate insight and judgement.   ____________________________________________   LABS (all labs ordered are listed, but only abnormal results are displayed)  Labs Reviewed  BASIC METABOLIC PANEL - Abnormal; Notable for the following components:      Result Value   Glucose, Bld 117 (*)    All other components within normal limits  CBC  TROPONIN I (HIGH SENSITIVITY)  TROPONIN I (HIGH SENSITIVITY)   ____________________________________________  EKG  ED ECG REPORT I, Charline Bills Attilio Zeitler,  personally viewed and interpreted this ECG.   Date: 11/10/2019  EKG Time: 1320 hrs.  Rate: 90 bpm  Rhythm: normal sinus rhythm  Axis: Normal axis  Intervals:none  ST&T Change: None no ST elevation or depression  Normal sinus rhythm.  No STEMI.  ____________________________________________  RADIOLOGY I personally viewed and evaluated these images as part of my medical decision making, as well as  reviewing the written report by the radiologist.  Concur with radiologist finding of no acute abnormality about the chest or head.  DG Chest 2 View  Result Date: 11/10/2019 CLINICAL DATA:  Onset chest pain 1 day ago. EXAM: CHEST - 2 VIEW COMPARISON:  PA and lateral chest 12/02/2018.  CT chest 08/08/2017. FINDINGS: Lungs are clear. Heart size is normal. No pneumothorax or pleural effusion. No acute or focal bony abnormality. Nerve stimulator device again seen. IMPRESSION: No acute disease. Electronically Signed   By: Inge Rise M.D.   On: 11/10/2019 13:00   CT Head Wo Contrast  Result Date: 11/10/2019 CLINICAL DATA:  Headache dizziness facial numbness with sweats and dyspnea EXAM: CT HEAD WITHOUT CONTRAST TECHNIQUE: Contiguous axial images were obtained from the base of the skull through the vertex without intravenous contrast. COMPARISON:  12/18/2017 FINDINGS: Brain: No evidence of acute infarction, hemorrhage, hydrocephalus, extra-axial collection or mass lesion/mass effect. Vascular: No hyperdense vessel or unexpected calcification. Skull: Normal. Negative for fracture or focal lesion. Sinuses/Orbits: Visualized sinuses and orbits are unremarkable. Other: None. IMPRESSION: No acute intracranial findings. Electronically Signed   By: Zetta Bills M.D.   On: 11/10/2019 17:14    ____________________________________________    PROCEDURES  Procedure(s) performed:    Procedures    Medications  sodium chloride flush (NS) 0.9 % injection 3 mL (has no administration in time range)     ____________________________________________   INITIAL IMPRESSION / ASSESSMENT AND PLAN / ED COURSE  Pertinent labs & imaging results that were available during my care of the patient were reviewed by me and considered in my medical decision making (see chart for details).  Review of the [place] CSRS was performed in accordance of the [place] prior to dispensing any controlled drugs.           Patient's  diagnosis is consistent with nonspecific chest pain, witnessed loss of MI.  Patient presented to emergency department with multiple complaints.  Patient was complaining of chest pain, left arm pain, possible slurred speech, weakness, shortness of breath.  Labs, chest x-ray, CT head, EKG are reassuring at this time.  No evidence of STEMI.  No evidence of CVA.  At this time, I will swab the patient for Covid given the fact that she has had reported sweats, headache, dizziness, shortness of breath, weakness as well as chest pain, numbness  and tingling of the extremities. I have advised patient to follow-up with both primary care as well as follow-up with cardiology.  No medications at this time..  Patient is given ED precautions to return to the ED for any worsening or new symptoms.     ____________________________________________  FINAL CLINICAL IMPRESSION(S) / ED DIAGNOSES  Final diagnoses:  Nonspecific chest pain  Numbness and tingling in left arm      NEW MEDICATIONS STARTED DURING THIS VISIT:  ED Discharge Orders    None          This chart was dictated using voice recognition software/Dragon. Despite best efforts to proofread, errors can occur which can change the meaning. Any change was purely unintentional.    Darletta Moll, PA-C 11/10/19 Doloris Hall, MD 11/10/19 2033

## 2019-11-10 NOTE — ED Notes (Signed)
Pt alert and laying calmly in bed. In NAD, skin dry, relaxed. Resp: regular/unlabored. Introduced self to pt and explained delay.

## 2019-11-10 NOTE — ED Triage Notes (Signed)
Pt states chest pain, shortness of breath, left arm pain (difficult to lift) starting yesterday evening. Pain has been constant.

## 2019-11-10 NOTE — ED Provider Notes (Signed)
Reviewed and interpreted by me at 1325 Heart rate 90 QRS 80 QTc 460 Normal sinus rhythm, no evidence of acute ischemia or ectopy.  Medical screening examination/treatment/procedure(s) were conducted as a shared visit with non-physician practitioner(s) and myself.  I personally evaluated the patient during the encounter.   Reviewed work-up, discussed with patient, she is resting comfortably in no distress at this time.  Appears appropriate and stable for further outpatient follow-up.  Patient comfortable with plan for discharge     Delman Kitten, MD 11/10/19 [phone]

## 2019-11-10 NOTE — ED Provider Notes (Signed)
[facility]    CSN: [id number] Arrival date & time: 11/10/19  1049      History   Chief Complaint Chief Complaint  Patient presents with  . Chest Pain  . Arm Pain    HPI Cynthia Fisher is a 64 y.o. female.   Subjective:  Cynthia Fisher is a 64 y.o. female who presents for evaluation of chest pain. Onset was 1 day ago around 6 PM while watching TV. The patient describes the pain as pressure and it does not radiate. She also endorses headache, dizziness, facial numbness, sweats, dyspnea, left arm pain/weakness, tingling of the left fingers and tingling of the toes in both feet. She denies any aggravating or alleviating factors. No fevers, chills, visual disturbances, speech problems, seizure activity or gait instability. Patient's has a history of dyslipidemia, seizure disorder and asthma. No hypertension, diabetes, tobacco use or previous diagnosis of heart disease. No prior history of TIA/CVA.   The following portions of the patient's history were reviewed and updated as appropriate: allergies, current medications, past family history, past medical history, past social history, past surgical history and problem list.        Past Medical History:  Diagnosis Date  . Asthma   . Cancer ([place])   . Ear pain   . Hypercholesteremia   . Restless legs   . Seizures [facility])     Patient Active Problem List   Diagnosis Date Noted  . Acute bronchitis 08/07/2017    Past Surgical History:  Procedure Laterality Date  . ABDOMINAL HYSTERECTOMY    . CHOLECYSTECTOMY    . INNER EAR SURGERY Right   . NASAL SINUS SURGERY      OB History   No obstetric history on file.      Home Medications    Prior to Admission medications   Medication Sig Start Date End Date Taking? Authorizing Provider  albuterol (PROVENTIL HFA;VENTOLIN HFA) 108 (90 Base) MCG/ACT inhaler Inhale 1-2 puffs into the lungs every 6 (six) hours as needed for wheezing or shortness of breath. 12/02/18    Norval Gable, MD  amphetamine-dextroamphetamine (ADDERALL) 20 MG tablet Take 20 mg by mouth daily.    [provider]  benzonatate (TESSALON) 200 MG capsule Take 1 capsule (200 mg total) by mouth 3 (three) times daily as needed. 12/02/18   Norval Gable, MD  cyclobenzaprine (FLEXERIL) 5 MG tablet cyclobenzaprine 5 mg tablet  Take 1 tablet every day by oral route at bedtime.    [provider]  fluticasone (FLONASE) 50 MCG/ACT nasal spray Place 2 sprays into both nostrils daily. 10/27/17   Lorin Picket, PA-C  ketoconazole (NIZORAL) 2 % cream Apply 1 application topically daily. Applied to ears and neck 05/30/18   Lorin Picket, PA-C  ketoconazole (NIZORAL) 2 % shampoo Apply 1 application topically 2 (two) times a week. 05/30/18   Lorin Picket, PA-C  lubiprostone (AMITIZA) 8 MCG capsule Take 8 mcg by mouth 2 (two) times daily with a meal.    [provider]  neomycin-polymyxin-hydrocortisone (CORTISPORIN) 3.5-10000-1 OTIC suspension Place 4 drops into the right ear 3 (three) times daily. 06/01/18   Norval Gable, MD  predniSONE (DELTASONE) 20 MG tablet 3 tabs po qd x 2 days, then 2 tabs po qd x 2 days, then 1 tab po qd x 2 days, then half a tab po qd x 2 days 12/02/18   Norval Gable, MD  simvastatin (ZOCOR) 20 MG tablet Take 20 mg by mouth  daily.    [provider]  sulfamethoxazole-trimethoprim (BACTRIM DS,SEPTRA DS) 800-160 MG tablet Take 1 tablet by mouth 2 (two) times daily. 06/01/18   Norval Gable, MD  topiramate (TOPAMAX) 100 MG tablet Take 100 mg by mouth 2 (two) times daily.    [provider]  triamcinolone cream (KENALOG) 0.1 % Apply 1 application topically 2 (two) times daily. Apply to hands and feet 05/30/18   Lorin Picket, PA-C  venlafaxine XR (EFFEXOR-XR) 150 MG 24 hr capsule Take 150 mg by mouth daily with breakfast.    [provider]    Family History Family History  Problem Relation Age of Onset  . COPD  Mother   . Heart disease Father   . Hypertension Father   . Diabetes Father   . Cancer Father     Social History Social History   Tobacco Use  . Smoking status: Never Smoker  . Smokeless tobacco: Never Used  Substance Use Topics  . Alcohol use: No  . Drug use: No     Allergies   Patient has no known allergies.   Review of Systems Review of Systems  Constitutional: Negative for fever.  Respiratory: Positive for shortness of breath. Negative for cough.   Cardiovascular: Positive for chest pain. Negative for palpitations.  Gastrointestinal: Negative for nausea and vomiting.  Neurological: Positive for dizziness, weakness, numbness and headaches. Negative for tremors, seizures, syncope, facial asymmetry and speech difficulty.  All other systems reviewed and are negative.    Physical Exam Triage Vital Signs ED Triage Vitals  Enc Vitals Group     BP 11/10/19 1053 (!) 183/94     Pulse Rate 11/10/19 1053 (!) 110     Resp 11/10/19 1053 20     Temp 11/10/19 1053 98.1 F (36.7 C)     Temp Source 11/10/19 1053 Oral     SpO2 11/10/19 1053 100 %     Weight 11/10/19 1054 134 lb (60.8 kg)     Height 11/10/19 1054 5' (1.524 m)     Head Circumference --      Peak Flow --      Pain Score 11/10/19 1054 9     Pain Loc --      Pain Edu? --      Excl. in [place]? --    No data found.  Updated Vital Signs BP (!) 183/94 (BP Location: Left Arm)   Pulse (!) 110   Temp 98.1 F (36.7 C) (Oral)   Resp 20   Ht 5' (1.524 m)   Wt 134 lb (60.8 kg)   SpO2 100%   BMI 26.17 kg/m   Visual Acuity Right Eye Distance:   Left Eye Distance:   Bilateral Distance:    Right Eye Near:   Left Eye Near:    Bilateral Near:     Physical Exam Vitals reviewed.  Constitutional:      General: She is not in acute distress.    Appearance: She is well-developed. She is not ill-appearing, toxic-appearing or diaphoretic.  HENT:     Head: Normocephalic.  Eyes:     Extraocular Movements:  Extraocular movements intact.     Pupils: Pupils are equal, round, and reactive to light.  Cardiovascular:     Rate and Rhythm: Regular rhythm. Tachycardia present.     Heart sounds: Normal heart sounds.  Pulmonary:     Breath sounds: Normal breath sounds.  Musculoskeletal:        General: Normal range of  motion.     Cervical back: Normal range of motion and neck supple.  Skin:    General: Skin is warm and dry.  Neurological:     Mental Status: She is alert.     GCS: GCS eye subscore is 4. GCS verbal subscore is 5. GCS motor subscore is 6.     Cranial Nerves: Cranial nerves are intact.     Sensory: Sensation is intact. No sensory deficit.     Gait: Gait is intact.     Comments: Patient is alert.  Answers questions correctly.  Performs task correctly.  Normal gaze.  No visual field loss.  Normal facial symmetrical movements.  Mild drift and ataxia noted to the left arm.  No lower extremity drift.  No sensory loss.  No aphasia or dysarthria.  NIH of 2.      UC Treatments / Results  Labs (all labs ordered are listed, but only abnormal results are displayed) Labs Reviewed  GLUCOSE, CAPILLARY - Abnormal; Notable for the following components:      Result Value   Glucose-Capillary 104 (*)    All other components within normal limits  CBG MONITORING, ED    EKG   Radiology No results found.  Procedures Procedures (including critical care time)  Medications Ordered in UC Medications  aspirin chewable tablet 324 mg (324 mg Oral Given 11/10/19 1102)    Initial Impression / Assessment and Plan / UC Course  I have reviewed the triage vital signs and the nursing notes.  Pertinent labs & imaging results that were available during my care of the patient were reviewed by me and considered in my medical decision making (see chart for details).    65 yo female presenting with a 1 day history of chest pressure, headache, dizziness, facial numbness, sweats, shortness of breath, left arm  pain/weakness, tingling of the left fingers and tingling of bilateral toes.  No prior history of CAD, TIA or CVA.  Patient is alert and oriented x3.  Nontoxic-appearing.  Afebrile.  Hypertensive at 183/94.  CBG 104.  EKG normal sinus rhythm. Rate 88. No ST-T changes. Aspirin 324 mg PO given. To emergency department via ambulance. Patient agreeable.   This care was provided during an unprecedented National Emergency due to the Novel Coronavirus (COVID-19) pandemic. COVID-19 infections and transmission risks place heavy strains on healthcare resources.  As this pandemic evolves, our facility, providers, and staff strive to respond fluidly, to remain operational, and to provide care relative to available resources and information. Outcomes are unpredictable and treatments are without well-defined guidelines. Further, the impact of COVID-19 on all aspects of urgent care, including the impact to patients seeking care for reasons other than COVID-19, is unavoidable during this national emergency. At this time of the global pandemic, management of patients has significantly changed, even for non-COVID positive patients given high local and regional COVID volumes at this time requiring high healthcare system and resource utilization. The standard of care for management of both COVID suspected and non-COVID suspected patients continues to change rapidly at the local, regional, national, and global levels. This patient was worked up and treated to the best available but ever changing evidence and resources available at this current time.   Documentation was completed with the aid of voice recognition software. Transcription may contain typographical errors.   Final Clinical Impressions(s) / UC Diagnoses   Final diagnoses:  Chest pain, unspecified type     Discharge Instructions     Go to ED  immediately via EMS for further evaluation and treatment of your symptoms.     ED Prescriptions    None     PDMP  not reviewed this encounter.   Enrique Sack, [place] 11/10/19 1115

## 2019-11-10 NOTE — ED Notes (Addendum)
Roderic Palau PA at bedside. Bed locked low. Rail up. Urine sample sent to lab.

## 2019-11-10 NOTE — ED Notes (Addendum)
Pt signed printed d/c paperwork. Grandson pt's ride home.

## 2019-11-10 NOTE — ED Triage Notes (Signed)
Pt from [facility] via ems with reports of left sided chest pain that began yesterday around 1800.

## 2019-11-10 NOTE — ED Notes (Signed)
Pt A&Ox4. Reports slurred speech, chest tightness, L arm pain, bilateral leg tingling since yesterday. Reports nausea and vomiting x1 today. Speech remains slurred. Pt currently does not display any aphasia. States "after they gave me that spray I got a bad headache". Denies CP having gotten significantly better since nitro spray.

## 2019-11-11 LAB — SARS CORONAVIRUS 2 (TAT 6-24 HRS): SARS Coronavirus 2: NEGATIVE

## 2019-12-08 ENCOUNTER — Encounter: Payer: Self-pay | Admitting: Cardiology

## 2019-12-08 ENCOUNTER — Other Ambulatory Visit: Payer: Self-pay

## 2019-12-08 ENCOUNTER — Ambulatory Visit (INDEPENDENT_AMBULATORY_CARE_PROVIDER_SITE_OTHER): Payer: Medicare Other | Admitting: Cardiology

## 2019-12-08 VITALS — BP 130/90 | HR 84 | Ht 60.0 in | Wt 137.5 lb

## 2019-12-08 DIAGNOSIS — E7800 Pure hypercholesterolemia, unspecified: Secondary | ICD-10-CM

## 2019-12-08 DIAGNOSIS — R079 Chest pain, unspecified: Secondary | ICD-10-CM

## 2019-12-08 DIAGNOSIS — R0600 Dyspnea, unspecified: Secondary | ICD-10-CM | POA: Diagnosis not present

## 2019-12-08 DIAGNOSIS — R072 Precordial pain: Secondary | ICD-10-CM | POA: Diagnosis not present

## 2019-12-08 DIAGNOSIS — R0609 Other forms of dyspnea: Secondary | ICD-10-CM

## 2019-12-08 MED ORDER — METOPROLOL TARTRATE 100 MG PO TABS: ORAL_TABLET | ORAL | 0 refills | Status: DC

## 2019-12-08 NOTE — Progress Notes (Signed)
Cardiology Office Note:    Date:  12/08/2019   ID:  Cynthia Fisher, DOB [birth year], MRN [id number]  PCP:  Lynnell Jude, MD  Cardiologist:  Kate Sable, MD  Electrophysiologist:  None   Referring MD: Delman Kitten, MD   Chief Complaint  Patient presents with  . New Patient (Initial Visit)    Cheat pain radiating to L arm; Meds verbally reviewed with patient.    History of Present Illness:    Cynthia Fisher is a 64 y.o. female with a hx of hyperlipidemia, asthma being seen for chest pain and dyspnea on exertion.  Patient states symptoms of chest pain started about a month ago.  She describes symptoms as severe pressure in her chest, 10 out of 10 in severity a month ago which prompted her to go to the emergency room.  She was at home cooking when symptoms began. In the emergency room, EKG did not show any ST changes to suggest ischemia, high-sensitivity troponins were normal.  He was discharged from the emergency room, she notes occasional chest discomfort not related with exertion.  She also endorses shortness of breath which occurs when she is engaged in activities such as vacuuming, walking.  Resting relieves symptoms.  She denies any history of heart disease.  Her mother had an MI in her 4s, her father had an MI in his 6s to 15s.   Past Medical History:  Diagnosis Date  . Asthma   . Cancer ([place])   . Ear pain   . Hypercholesteremia   . Restless legs   . Seizures ([place])     Past Surgical History:  Procedure Laterality Date  . ABDOMINAL HYSTERECTOMY    . CHOLECYSTECTOMY    . INNER EAR SURGERY Right   . NASAL SINUS SURGERY      Current Medications: Current Meds  Medication Sig  . albuterol (PROVENTIL HFA;VENTOLIN HFA) 108 (90 Base) MCG/ACT inhaler Inhale 1-2 puffs into the lungs every 6 (six) hours as needed for wheezing or shortness of breath.  . amphetamine-dextroamphetamine (ADDERALL) 20 MG tablet Take 20 mg by mouth daily.  . benzonatate (TESSALON) 200 MG  capsule Take 1 capsule (200 mg total) by mouth 3 (three) times daily as needed.  . cyclobenzaprine (FLEXERIL) 5 MG tablet cyclobenzaprine 5 mg tablet  Take 1 tablet every day by oral route at bedtime.  . fluticasone (FLONASE) 50 MCG/ACT nasal spray Place 2 sprays into both nostrils daily.  Marland Kitchen lubiprostone (AMITIZA) 8 MCG capsule Take 8 mcg by mouth 2 (two) times daily with a meal.  . neomycin-polymyxin-hydrocortisone (CORTISPORIN) 3.5-10000-1 OTIC suspension Place 4 drops into the right ear 3 (three) times daily.  . simvastatin (ZOCOR) 20 MG tablet Take 20 mg by mouth daily.  Marland Kitchen topiramate (TOPAMAX) 100 MG tablet Take 100 mg by mouth 2 (two) times daily.  Marland Kitchen triamcinolone cream (KENALOG) 0.1 % Apply 1 application topically 2 (two) times daily. Apply to hands and feet  . venlafaxine XR (EFFEXOR-XR) 150 MG 24 hr capsule Take 150 mg by mouth daily with breakfast.     Allergies:   Patient has no known allergies.   Social History   Socioeconomic History  . Marital status: Widowed    Spouse name: Not on file  . Number of children: Not on file  . Years of education: Not on file  . Highest education level: Not on file  Occupational History  . Not on file  Tobacco Use  . Smoking status: Never Smoker  .  Smokeless tobacco: Never Used  Substance and Sexual Activity  . Alcohol use: No  . Drug use: No  . Sexual activity: Not on file  Other Topics Concern  . Not on file  Social History Narrative  . Not on file   Social Determinants of Health   Financial Resource Strain:   . Difficulty of Paying Living Expenses: Not on file  Food Insecurity:   . Worried About Charity fundraiser in the Last Year: Not on file  . Ran Out of Food in the Last Year: Not on file  Transportation Needs:   . Lack of Transportation (Medical): Not on file  . Lack of Transportation (Non-Medical): Not on file  Physical Activity:   . Days of Exercise per Week: Not on file  . Minutes of Exercise per Session: Not on  file  Stress:   . Feeling of Stress : Not on file  Social Connections:   . Frequency of Communication with Friends and Family: Not on file  . Frequency of Social Gatherings with Friends and Family: Not on file  . Attends Religious Services: Not on file  . Active Member of Clubs or Organizations: Not on file  . Attends Archivist Meetings: Not on file  . Marital Status: Not on file     Family History: The patient's family history includes COPD in her mother; Cancer in her father; Diabetes in her father; Heart disease in her father; Hypertension in her father.  ROS:   Please see the history of present illness.     All other systems reviewed and are negative.  EKGs/Labs/Other Studies Reviewed:    The following studies were reviewed today:   EKG:  EKG is  ordered today.  The ekg ordered today demonstrates normal sinus rhythm, nonspecific T wave changes.  Recent Labs: 11/10/2019: BUN 17; Creatinine, Ser 0.47; Hemoglobin 13.2; Platelets 231; Potassium 3.8; Sodium 139  Recent Lipid Panel No results found for: CHOL, TRIG, HDL, CHOLHDL, VLDL, LDLCALC, LDLDIRECT  Physical Exam:    VS:  BP 130/90 (BP Location: Right Arm, Patient Position: Sitting, Cuff Size: Normal)   Pulse 84   Ht 5' (1.524 m)   Wt 137 lb 8 oz (62.4 kg)   SpO2 98%   BMI 26.85 kg/m     Wt Readings from Last 3 Encounters:  12/08/19 137 lb 8 oz (62.4 kg)  11/10/19 134 lb (60.8 kg)  12/02/18 140 lb (63.5 kg)     GEN:  Well nourished, well developed in no acute distress HEENT: Normal NECK: No JVD; No carotid bruits LYMPHATICS: No lymphadenopathy CARDIAC: RRR, no murmurs, rubs, gallops RESPIRATORY:  Clear to auscultation without rales, wheezing or rhonchi  ABDOMEN: Soft, non-tender, non-distended MUSCULOSKELETAL:  No edema; No deformity  SKIN: Warm and dry NEUROLOGIC:  Alert and oriented x 3 PSYCHIATRIC:  Normal affect   ASSESSMENT:    1. Chest pain of uncertain etiology   2. Dyspnea on exertion    3. Pure hypercholesterolemia   4. Precordial pain    PLAN:    In order of problems listed above:  1. Patient with symptoms of atypical chest pain.  Has risk factors of hyperlipidemia and family history of CAD.  Also has dyspnea on exertion which could be an anginal equivalent.  Will get echocardiogram and coronary CTA to evaluate for CAD presence. 2. Her dyspnea on exertion could be an anginal equivalent or simply from deconditioning.  We will evaluate with echo and coronary CTA as above.  3. History of hyperlipidemia, continue statin as prescribed.  Follow-up after above testing.  This note was generated in part or whole with voice recognition software. Voice recognition is usually quite accurate but there are transcription errors that can and very often do occur. I apologize for any typographical errors that were not detected and corrected.  Medication Adjustments/Labs and Tests Ordered: Current medicines are reviewed at length with the patient today.  Concerns regarding medicines are outlined above.  Orders Placed This Encounter  Procedures  . CT CORONARY MORPH W/CTA COR W/SCORE W/CA W/CM &/OR WO/CM  . CT CORONARY FRACTIONAL FLOW RESERVE DATA PREP  . CT CORONARY FRACTIONAL FLOW RESERVE FLUID ANALYSIS  . EKG 12-Lead  . ECHOCARDIOGRAM COMPLETE   Meds ordered this encounter  Medications  . metoprolol tartrate (LOPRESSOR) 100 MG tablet    Sig: Take 1 tablet (100 mg) by mouth x 1 dose, 2 hours prior to your Cardiac CT    Dispense:  1 tablet    Refill:  0    Patient Instructions  Medication Instructions:  - Your physician recommends that you continue on your current medications as directed. Please refer to the Current Medication list given to you today.  *If you need a refill on your cardiac medications before your next appointment, please call your pharmacy*  Lab Work: - none ordered  If you have labs (blood work) drawn today and your tests are completely normal, you will  receive your results only by: Marland Kitchen MyChart Message (if you have MyChart) OR . A paper copy in the mail If you have any lab test that is abnormal or we need to change your treatment, we will call you to review the results.  Testing/Procedures: -Your physician has requested that you have an echocardiogram. Echocardiography is a painless test that uses sound waves to create images of your heart. It provides your doctor with information about the size and shape of your heart and how well your heart's chambers and valves are working. This procedure takes approximately one hour. There are no restrictions for this procedure.  - Your physician has requested that you have cardiac CT. Cardiac computed tomography (CT) is a painless test that uses an x-ray machine to take clear, detailed pictures of your heart.   - Your cardiac CT will be scheduled at one of the below locations:   [facility] [address] [place], [place] [address] [phone] [place] [address] [place], [place] [address] [phone]  If scheduled at [facility], please arrive at the [facility] main entrance of [facility] 30-45 minutes prior to test start time. Proceed to the [facility] Radiology Department (first floor) to check-in and test prep.  If scheduled at [facility], please arrive 15 mins early for check-in and test prep.  Please follow these instructions carefully (unless otherwise directed):   On the Night Before the Test: . Be sure to Drink plenty of water. . Do not consume any caffeinated/decaffeinated beverages or chocolate 12 hours prior to your test. . Do not take any antihistamines 12 hours prior to your test.   On the Day of the Test: . Drink plenty of water. Do not drink any water within one hour of the test. . Do not eat any food 4 hours prior to the test. . You may take your regular  medications prior to the test.  . Take metoprolol (Lopressor) 100 mg  x 1 dose two hours prior to test. . HOLD Furosemide/Hydrochlorothiazide morning of the test. . FEMALES- please wear underwire-free bra if available      After the Test: . Drink plenty of water. . After receiving IV contrast, you may experience a mild flushed feeling. This is normal. . On occasion, you may experience a mild rash up to 24 hours after the test. This is not dangerous. If this occurs, you can take Benadryl 25 mg and increase your fluid intake. . If you experience trouble breathing, this can be serious. If it is severe call 911 IMMEDIATELY. If it is mild, please call our office. . If you take any of these medications: Glipizide/Metformin, Avandament, Glucavance, please do not take 48 hours after completing test unless otherwise instructed.   Once we have confirmed authorization from your insurance company, we will call you to set up a date and time for your test.   For non-scheduling related questions, please contact the cardiac imaging nurse navigator should you have any questions/concerns: Marchia Bond, RN Navigator Cardiac Imaging [facility] [phone] Office     Follow-Up: At [facility], you and your health needs are our priority.  As part of our continuing mission to provide you with exceptional heart care, we have created designated Provider Care Teams.  These Care Teams include your primary Cardiologist (physician) and Advanced Practice Providers (APPs -  Physician Assistants and Nurse Practitioners) who all work together to provide you with the care you need, when you need it.  Your next appointment:   6-8 weeks/ after all cardiac testing is completed   The format for your next appointment:   In Person  Provider:   Kate Sable, MD  Other Instructions N/a   Echocardiogram An echocardiogram is a procedure that uses painless sound waves (ultrasound) to  produce an image of the heart. Images from an echocardiogram can provide important information about:  Signs of coronary artery disease (CAD).  Aneurysm detection. An aneurysm is a weak or damaged part of an artery wall that bulges out from the normal force of blood pumping through the body.  Heart size and shape. Changes in the size or shape of the heart can be associated with certain conditions, including heart failure, aneurysm, and CAD.  Heart muscle function.  Heart valve function.  Signs of a past heart attack.  Fluid buildup around the heart.  Thickening of the heart muscle.  A tumor or infectious growth around the heart valves. Tell a health care provider about:  Any allergies you have.  All medicines you are taking, including vitamins, herbs, eye drops, creams, and over-the-counter medicines.  Any blood disorders you have.  Any surgeries you have had.  Any medical conditions you have.  Whether you are pregnant or may be pregnant. What are the risks? Generally, this is a safe procedure. However, problems may occur, including:  Allergic reaction to dye (contrast) that may be used during the procedure. What happens before the procedure? No specific preparation is needed. You may eat and drink normally. What happens during the procedure?   An IV tube may be inserted into one of your veins.  You may receive contrast through this tube. A contrast is an injection that improves the quality of the pictures from your heart.  A gel will be applied to your chest.  A wand-like tool (transducer) will be moved over your chest. The gel will help to transmit the sound waves from the transducer.  The sound waves will harmlessly bounce off of your heart to allow the heart images to be captured in real-time motion. The images will be recorded on a computer. The procedure may vary among health care providers and hospitals. What happens after the procedure?  You may return to  your normal, everyday life, including diet, activities, and medicines, unless your health care provider tells you not to do that. Summary  An echocardiogram is a procedure that uses painless sound waves (ultrasound) to produce an image of the heart.  Images from an echocardiogram can provide important information about the size and shape of your heart, heart muscle function, heart valve function, and fluid buildup around your heart.  You do not need to do anything to prepare before this procedure. You may eat and drink normally.  After the echocardiogram is completed, you may return to your normal, everyday life, unless your health care provider tells you not to do that. This information is not intended to replace advice given to you by your health care provider. Make sure you discuss any questions you have with your health care provider. Document Revised: 02/13/2019 Document Reviewed: 11/25/2016 Elsevier Patient Education  [place].    Cardiac CT Angiogram A cardiac CT angiogram is a procedure to look at the heart and the area around the heart. It may be done to help find the cause of chest pains or other symptoms of heart disease. During this procedure, a substance called contrast dye is injected into the blood vessels in the area to be checked. A large X-ray machine, called a CT scanner, then takes detailed pictures of the heart and the surrounding area. The procedure is also sometimes called a coronary CT angiogram, coronary artery scanning, or CTA. A cardiac CT angiogram allows the health care provider to see how well blood is flowing to and from the heart. The health care provider will be able to see if there are any problems, such as:  Blockage or narrowing of the coronary arteries in the heart.  Fluid around the heart.  Signs of weakness or disease in the muscles, valves, and tissues of the heart. Tell a health care provider about:  Any allergies you have. This is  especially important if you have had a previous allergic reaction to contrast dye.  All medicines you are taking, including vitamins, herbs, eye drops, creams, and over-the-counter medicines.  Any blood disorders you have.  Any surgeries you have had.  Any medical conditions you have.  Whether you are pregnant or may be pregnant.  Any anxiety disorders, chronic pain, or other conditions you have that may increase your stress or prevent you from lying still. What are the risks? Generally, this is a safe procedure. However, problems may occur, including:  Bleeding.  Infection.  Allergic reactions to medicines or dyes.  Damage to other structures or organs.  Kidney damage from the contrast dye that is used.  Increased risk of cancer from radiation exposure. This risk is low. Talk with your health care provider about: ? The risks and benefits of testing. ? How you can receive the lowest dose of radiation. What happens before the procedure?  Wear comfortable clothing and remove any jewelry, glasses, dentures, and hearing aids.  Follow instructions from your health care provider about eating and drinking. This may include: ? For 12 hours before the procedure -- avoid caffeine. This includes tea, coffee, soda, energy drinks, and diet pills. Drink plenty of water or other fluids that  do not have caffeine in them. Being well hydrated can prevent complications. ? For 4-6 hours before the procedure -- stop eating and drinking. The contrast dye can cause nausea, but this is less likely if your stomach is empty.  Ask your health care provider about changing or stopping your regular medicines. This is especially important if you are taking diabetes medicines, blood thinners, or medicines to treat problems with erections (erectile dysfunction). What happens during the procedure?   Hair on your chest may need to be removed so that small sticky patches called electrodes can be placed on your  chest. These will transmit information that helps to monitor your heart during the procedure.  An IV will be inserted into one of your veins.  You might be given a medicine to control your heart rate during the procedure. This will help to ensure that good images are obtained.  You will be asked to lie on an exam table. This table will slide in and out of the CT machine during the procedure.  Contrast dye will be injected into the IV. You might feel warm, or you may get a metallic taste in your mouth.  You will be given a medicine called nitroglycerin. This will relax or dilate the arteries in your heart.  The table that you are lying on will move into the CT machine tunnel for the scan.  The person running the machine will give you instructions while the scans are being done. You may be asked to: ? Keep your arms above your head. ? Hold your breath. ? Stay very still, even if the table is moving.  When the scanning is complete, you will be moved out of the machine.  The IV will be removed. The procedure may vary among health care providers and hospitals. What can I expect after the procedure? After your procedure, it is common to have:  A metallic taste in your mouth from the contrast dye.  A feeling of warmth.  A headache from the nitroglycerin. Follow these instructions at home:  Take over-the-counter and prescription medicines only as told by your health care provider.  If you are told, drink enough fluid to keep your urine pale yellow. This will help to flush the contrast dye out of your body.  Most people can return to their normal activities right after the procedure. Ask your health care provider what activities are safe for you.  It is up to you to get the results of your procedure. Ask your health care provider, or the department that is doing the procedure, when your results will be ready.  Keep all follow-up visits as told by your health care provider. This is  important. Contact a health care provider if:  You have any symptoms of allergy to the contrast dye. These include: ? Shortness of breath. ? Rash or hives. ? A racing heartbeat. Summary  A cardiac CT angiogram is a procedure to look at the heart and the area around the heart. It may be done to help find the cause of chest pains or other symptoms of heart disease.  During this procedure, a large X-ray machine, called a CT scanner, takes detailed pictures of the heart and the surrounding area after a contrast dye has been injected into blood vessels in the area.  Ask your health care provider about changing or stopping your regular medicines before the procedure. This is especially important if you are taking diabetes medicines, blood thinners, or medicines to treat  erectile dysfunction.  If you are told, drink enough fluid to keep your urine pale yellow. This will help to flush the contrast dye out of your body. This information is not intended to replace advice given to you by your health care provider. Make sure you discuss any questions you have with your health care provider. Document Revised: 06/18/2019 Document Reviewed: 06/18/2019 Elsevier Patient Education  2020 [place], Kate Sable, MD  12/08/2019 3:34 PM    [facility]

## 2019-12-08 NOTE — Patient Instructions (Addendum)
Medication Instructions:  - Your physician recommends that you continue on your current medications as directed. Please refer to the Current Medication list given to you today.  *If you need a refill on your cardiac medications before your next appointment, please call your pharmacy*  Lab Work: - none ordered  If you have labs (blood work) drawn today and your tests are completely normal, you will receive your results only by:  [place] (if you have MyChart) OR  A paper copy in the mail If you have any lab test that is abnormal or we need to change your treatment, we will call you to review the results.  Testing/Procedures: -Your physician has requested that you have an echocardiogram. Echocardiography is a painless test that uses sound waves to create images of your heart. It provides your doctor with information about the size and shape of your heart and how well your hearts chambers and valves are working. This procedure takes approximately one hour. There are no restrictions for this procedure.  - Your physician has requested that you have cardiac CT. Cardiac computed tomography (CT) is a painless test that uses an x-ray machine to take clear, detailed pictures of your heart.   - Your cardiac CT will be scheduled at one of the below locations:   [facility] [address] [place], [place] [address] ([phone] [place] [address] [place], [place] [address] [phone]  If scheduled at [facility], please arrive at the [facility] main entrance of [facility] 30-45 minutes prior to test start time. Proceed to the [facility] Radiology Department (first floor) to check-in and test prep.  If scheduled at [facility], please arrive 15 mins early for check-in and test prep.  Please follow these instructions carefully (unless otherwise directed):   On the  Night Before the Test:  Be sure to Drink plenty of water.  Do not consume any caffeinated/decaffeinated beverages or chocolate 12 hours prior to your test.  Do not take any antihistamines 12 hours prior to your test.   On the Day of the Test:  Drink plenty of water. Do not drink any water within one hour of the test.  Do not eat any food 4 hours prior to the test.  You may take your regular medications prior to the test.   Take metoprolol (Lopressor) 100 mg x 1 dose two hours prior to test.  HOLD Furosemide/Hydrochlorothiazide morning of the test.  FEMALES- please wear underwire-free bra if available      After the Test:  Drink plenty of water.  After receiving IV contrast, you may experience a mild flushed feeling. This is normal.  On occasion, you may experience a mild rash up to 24 hours after the test. This is not dangerous. If this occurs, you can take Benadryl 25 mg and increase your fluid intake.  If you experience trouble breathing, this can be serious. If it is severe call 911 IMMEDIATELY. If it is mild, please call our office.  If you take any of these medications: Glipizide/Metformin, Avandament, Glucavance, please do not take 48 hours after completing test unless otherwise instructed.   Once we have confirmed authorization from your insurance company, we will call you to set up a date and time for your test.   For non-scheduling related questions, please contact the cardiac imaging nurse navigator should you have any questions/concerns: Marchia Bond, RN Navigator Cardiac  Imaging [facility] [phone] Office     Follow-Up: At [facility], you and your health needs are our priority.  As part of our continuing mission to provide you with exceptional heart care, we have created designated Provider Care Teams.  These Care Teams include your primary Cardiologist (physician) and Advanced Practice Providers (APPs -  Physician  Assistants and Nurse Practitioners) who all work together to provide you with the care you need, when you need it.  Your next appointment:   6-8 weeks/ after all cardiac testing is completed   The format for your next appointment:   In Person  Provider:   Kate Sable, MD  Other Instructions N/a   Echocardiogram An echocardiogram is a procedure that uses painless sound waves (ultrasound) to produce an image of the heart. Images from an echocardiogram can provide important information about:  Signs of coronary artery disease (CAD).  Aneurysm detection. An aneurysm is a weak or damaged part of an artery wall that bulges out from the normal force of blood pumping through the body.  Heart size and shape. Changes in the size or shape of the heart can be associated with certain conditions, including heart failure, aneurysm, and CAD.  Heart muscle function.  Heart valve function.  Signs of a past heart attack.  Fluid buildup around the heart.  Thickening of the heart muscle.  A tumor or infectious growth around the heart valves. Tell a health care provider about:  Any allergies you have.  All medicines you are taking, including vitamins, herbs, eye drops, creams, and over-the-counter medicines.  Any blood disorders you have.  Any surgeries you have had.  Any medical conditions you have.  Whether you are pregnant or may be pregnant. What are the risks? Generally, this is a safe procedure. However, problems may occur, including:  Allergic reaction to dye (contrast) that may be used during the procedure. What happens before the procedure? No specific preparation is needed. You may eat and drink normally. What happens during the procedure?   An IV tube may be inserted into one of your veins.  You may receive contrast through this tube. A contrast is an injection that improves the quality of the pictures from your heart.  A gel will be applied to your chest.  A  wand-like tool (transducer) will be moved over your chest. The gel will help to transmit the sound waves from the transducer.  The sound waves will harmlessly bounce off of your heart to allow the heart images to be captured in real-time motion. The images will be recorded on a computer. The procedure may vary among health care providers and hospitals. What happens after the procedure?  You may return to your normal, everyday life, including diet, activities, and medicines, unless your health care provider tells you not to do that. Summary  An echocardiogram is a procedure that uses painless sound waves (ultrasound) to produce an image of the heart.  Images from an echocardiogram can provide important information about the size and shape of your heart, heart muscle function, heart valve function, and fluid buildup around your heart.  You do not need to do anything to prepare before this procedure. You may eat and drink normally.  After the echocardiogram is completed, you may return to your normal, everyday life, unless your health care provider tells you not to do that. This information is not intended to replace advice given to you by your health care provider. Make  sure you discuss any questions you have with your health care provider. Document Revised: 02/13/2019 Document Reviewed: 11/25/2016 Elsevier Patient Education  [place].    Cardiac CT Angiogram A cardiac CT angiogram is a procedure to look at the heart and the area around the heart. It may be done to help find the cause of chest pains or other symptoms of heart disease. During this procedure, a substance called contrast dye is injected into the blood vessels in the area to be checked. A large X-ray machine, called a CT scanner, then takes detailed pictures of the heart and the surrounding area. The procedure is also sometimes called a coronary CT angiogram, coronary artery scanning, or CTA. A cardiac CT angiogram allows  the health care provider to see how well blood is flowing to and from the heart. The health care provider will be able to see if there are any problems, such as:  Blockage or narrowing of the coronary arteries in the heart.  Fluid around the heart.  Signs of weakness or disease in the muscles, valves, and tissues of the heart. Tell a health care provider about:  Any allergies you have. This is especially important if you have had a previous allergic reaction to contrast dye.  All medicines you are taking, including vitamins, herbs, eye drops, creams, and over-the-counter medicines.  Any blood disorders you have.  Any surgeries you have had.  Any medical conditions you have.  Whether you are pregnant or may be pregnant.  Any anxiety disorders, chronic pain, or other conditions you have that may increase your stress or prevent you from lying still. What are the risks? Generally, this is a safe procedure. However, problems may occur, including:  Bleeding.  Infection.  Allergic reactions to medicines or dyes.  Damage to other structures or organs.  Kidney damage from the contrast dye that is used.  Increased risk of cancer from radiation exposure. This risk is low. Talk with your health care provider about: ? The risks and benefits of testing. ? How you can receive the lowest dose of radiation. What happens before the procedure?  Wear comfortable clothing and remove any jewelry, glasses, dentures, and hearing aids.  Follow instructions from your health care provider about eating and drinking. This may include: ? For 12 hours before the procedure -- avoid caffeine. This includes tea, coffee, soda, energy drinks, and diet pills. Drink plenty of water or other fluids that do not have caffeine in them. Being well hydrated can prevent complications. ? For 4-6 hours before the procedure -- stop eating and drinking. The contrast dye can cause nausea, but this is less likely if your  stomach is empty.  Ask your health care provider about changing or stopping your regular medicines. This is especially important if you are taking diabetes medicines, blood thinners, or medicines to treat problems with erections (erectile dysfunction). What happens during the procedure?   Hair on your chest may need to be removed so that small sticky patches called electrodes can be placed on your chest. These will transmit information that helps to monitor your heart during the procedure.  An IV will be inserted into one of your veins.  You might be given a medicine to control your heart rate during the procedure. This will help to ensure that good images are obtained.  You will be asked to lie on an exam table. This table will slide in and out of the CT machine during the procedure.  Contrast  dye will be injected into the IV. You might feel warm, or you may get a metallic taste in your mouth.  You will be given a medicine called nitroglycerin. This will relax or dilate the arteries in your heart.  The table that you are lying on will move into the CT machine tunnel for the scan.  The person running the machine will give you instructions while the scans are being done. You may be asked to: ? Keep your arms above your head. ? Hold your breath. ? Stay very still, even if the table is moving.  When the scanning is complete, you will be moved out of the machine.  The IV will be removed. The procedure may vary among health care providers and hospitals. What can I expect after the procedure? After your procedure, it is common to have:  A metallic taste in your mouth from the contrast dye.  A feeling of warmth.  A headache from the nitroglycerin. Follow these instructions at home:  Take over-the-counter and prescription medicines only as told by your health care provider.  If you are told, drink enough fluid to keep your urine pale yellow. This will help to flush the contrast dye  out of your body.  Most people can return to their normal activities right after the procedure. Ask your health care provider what activities are safe for you.  It is up to you to get the results of your procedure. Ask your health care provider, or the department that is doing the procedure, when your results will be ready.  Keep all follow-up visits as told by your health care provider. This is important. Contact a health care provider if:  You have any symptoms of allergy to the contrast dye. These include: ? Shortness of breath. ? Rash or hives. ? A racing heartbeat. Summary  A cardiac CT angiogram is a procedure to look at the heart and the area around the heart. It may be done to help find the cause of chest pains or other symptoms of heart disease.  During this procedure, a large X-ray machine, called a CT scanner, takes detailed pictures of the heart and the surrounding area after a contrast dye has been injected into blood vessels in the area.  Ask your health care provider about changing or stopping your regular medicines before the procedure. This is especially important if you are taking diabetes medicines, blood thinners, or medicines to treat erectile dysfunction.  If you are told, drink enough fluid to keep your urine pale yellow. This will help to flush the contrast dye out of your body. This information is not intended to replace advice given to you by your health care provider. Make sure you discuss any questions you have with your health care provider. Document Revised: 06/18/2019 Document Reviewed: 06/18/2019 Elsevier Patient Education  [place].

## 2019-12-18 ENCOUNTER — Telehealth: Payer: Self-pay | Admitting: Cardiology

## 2019-12-18 DIAGNOSIS — R072 Precordial pain: Secondary | ICD-10-CM

## 2019-12-18 DIAGNOSIS — Z01812 Encounter for preprocedural laboratory examination: Secondary | ICD-10-CM

## 2019-12-18 NOTE — Telephone Encounter (Signed)
Message received from Jannet Askew with scheduling that the patient's Cardiac CTA has been scheduled for 01/08/20, but she will need a BMP prior to that for her Cardiac CTA.  I attempted to call the patient to see if she can stop by the lab at the [facility] on 12/25/19, just before or just after her echo, to have this done.  No answer- I left a message to please call back.   BMP order placed for the [facility] lab.

## 2019-12-23 NOTE — Telephone Encounter (Signed)
Attempted to call the patient. No answer- I left a message to please call back.  

## 2019-12-25 ENCOUNTER — Other Ambulatory Visit: Payer: Medicare Other

## 2019-12-31 NOTE — Telephone Encounter (Signed)
Attempted to call the patient to remind her to go for her BMP prior to her Cardiac CTA on 3/4. No answer- I left a message to please call back.

## 2020-01-02 NOTE — Telephone Encounter (Signed)
Attempted to reach patient but no answer. Left message that lab work needed at the [facility] prior to CT on 01/08/20. I did not leave any patient identifying information on the VM. Asked for patient to call back for any questions.

## 2020-01-07 ENCOUNTER — Telehealth (HOSPITAL_COMMUNITY): Payer: Self-pay | Admitting: Emergency Medicine

## 2020-01-07 NOTE — Telephone Encounter (Signed)
Left message on voicemail with name and callback number Nasim Habeeb RN Navigator Cardiac Imaging [facility] [phone] Office [phone] Cell  

## 2020-01-08 ENCOUNTER — Ambulatory Visit
Admission: RE | Admit: 2020-01-08 | Discharge: 2020-01-08 | Disposition: A | Discharge status: Home / Self Care | Payer: Medicare Other | Source: Ambulatory Visit | Attending: Cardiology | Admitting: Cardiology

## 2020-01-08 ENCOUNTER — Other Ambulatory Visit: Payer: Self-pay

## 2020-01-08 DIAGNOSIS — R072 Precordial pain: Secondary | ICD-10-CM

## 2020-01-08 LAB — POCT I-STAT CREATININE: Creatinine, Ser: 0.7 mg/dL (ref 0.44–1.00)

## 2020-01-08 MED ORDER — METOPROLOL TARTRATE 5 MG/5ML IV SOLN
5.0000 mg | INTRAVENOUS | Status: DC | PRN
  Administered 2020-01-08: 5 mg via INTRAVENOUS

## 2020-01-08 MED ORDER — IOHEXOL 350 MG/ML SOLN: 100.0000 mL | Freq: Once | INTRAVENOUS | Status: DC | PRN

## 2020-01-08 MED ORDER — NITROGLYCERIN 0.4 MG SL SUBL
0.8000 mg | SUBLINGUAL_TABLET | Freq: Once | SUBLINGUAL | Status: AC
  Administered 2020-01-08: 0.8 mg via SUBLINGUAL

## 2020-01-08 MED ORDER — IOHEXOL 350 MG/ML SOLN
75.0000 mL | Freq: Once | INTRAVENOUS | Status: AC | PRN
  Administered 2020-01-08: 75 mL via INTRAVENOUS

## 2020-01-08 NOTE — Progress Notes (Signed)
Patient tolerated CT well. Developed a headache after 4/10 throbbing. Drank coke after. Stated headache feels better. Headache improved 2/10 throbbing. Ambulatory steady gait to exit.

## 2020-01-09 ENCOUNTER — Telehealth: Payer: Self-pay | Admitting: Cardiology

## 2020-01-09 NOTE — Telephone Encounter (Signed)
Attempted to call the patient. No answer- I left a message to please call back.  

## 2020-01-09 NOTE — Telephone Encounter (Signed)
Spoke with patient and reviewed results with her and she verbalized understanding with no further questions. Confirmed her upcoming appointments here in our office with echo and provider.

## 2020-01-09 NOTE — Telephone Encounter (Signed)
Kate Sable, MD  01/08/2020 4:48 PM EST    Minimal coronary calcifications. No evidence for obstructive CAD. Keep follow-up appointment.

## 2020-01-09 NOTE — Telephone Encounter (Signed)
Left voicemail message to call back  

## 2020-02-02 ENCOUNTER — Other Ambulatory Visit: Payer: Medicare Other

## 2020-02-06 ENCOUNTER — Ambulatory Visit: Payer: Medicare Other | Admitting: Cardiology

## 2020-03-01 ENCOUNTER — Other Ambulatory Visit: Payer: Self-pay

## 2020-03-01 ENCOUNTER — Ambulatory Visit
Admission: EM | Admit: 2020-03-01 | Discharge: 2020-03-01 | Disposition: A | Discharge status: Home / Self Care | Payer: Medicare Other | Attending: Emergency Medicine | Admitting: Emergency Medicine

## 2020-03-01 ENCOUNTER — Encounter: Payer: Self-pay | Admitting: Emergency Medicine

## 2020-03-01 ENCOUNTER — Telehealth: Payer: Self-pay | Admitting: Adult Health

## 2020-03-01 ENCOUNTER — Ambulatory Visit (INDEPENDENT_AMBULATORY_CARE_PROVIDER_SITE_OTHER): Payer: Medicare Other

## 2020-03-01 DIAGNOSIS — R112 Nausea with vomiting, unspecified: Secondary | ICD-10-CM | POA: Diagnosis present

## 2020-03-01 DIAGNOSIS — R197 Diarrhea, unspecified: Secondary | ICD-10-CM | POA: Diagnosis present

## 2020-03-01 DIAGNOSIS — U071 COVID-19: Secondary | ICD-10-CM

## 2020-03-01 LAB — CBC WITH DIFFERENTIAL/PLATELET
Abs Immature Granulocytes: 0.01 10*3/uL (ref 0.00–0.07)
Basophils Absolute: 0 10*3/uL (ref 0.0–0.1)
Basophils Relative: 0 %
Eosinophils Absolute: 0 10*3/uL (ref 0.0–0.5)
Eosinophils Relative: 0 %
HCT: 46.3 % — ABNORMAL HIGH (ref 36.0–46.0)
Hemoglobin: 16 g/dL — ABNORMAL HIGH (ref 12.0–15.0)
Immature Granulocytes: 0 %
Lymphocytes Relative: 36 %
Lymphs Abs: 1.5 10*3/uL (ref 0.7–4.0)
MCH: 29 pg (ref 26.0–34.0)
MCHC: 34.6 g/dL (ref 30.0–36.0)
MCV: 84 fL (ref 80.0–100.0)
Monocytes Absolute: 0.4 10*3/uL (ref 0.1–1.0)
Monocytes Relative: 10 %
Neutro Abs: 2.4 10*3/uL (ref 1.7–7.7)
Neutrophils Relative %: 54 %
Platelets: 161 10*3/uL (ref 150–400)
RBC: 5.51 MIL/uL — ABNORMAL HIGH (ref 3.87–5.11)
RDW: 12.4 % (ref 11.5–15.5)
WBC: 4.3 10*3/uL (ref 4.0–10.5)
nRBC: 0 % (ref 0.0–0.2)

## 2020-03-01 LAB — COMPREHENSIVE METABOLIC PANEL
ALT: 19 U/L (ref 0–44)
AST: 26 U/L (ref 15–41)
Albumin: 3.8 g/dL (ref 3.5–5.0)
Alkaline Phosphatase: 53 U/L (ref 38–126)
Anion gap: 11 (ref 5–15)
BUN: 18 mg/dL (ref 8–23)
CO2: 25 mmol/L (ref 22–32)
Calcium: 8.7 mg/dL — ABNORMAL LOW (ref 8.9–10.3)
Chloride: 98 mmol/L (ref 98–111)
Creatinine, Ser: 0.72 mg/dL (ref 0.44–1.00)
GFR calc Af Amer: >60 mL/min (ref >60)
GFR calc non Af Amer: >60 mL/min (ref >60)
Glucose, Bld: 125 mg/dL — ABNORMAL HIGH (ref 70–99)
Potassium: 3.6 mmol/L (ref 3.5–5.1)
Sodium: 134 mmol/L — ABNORMAL LOW (ref 135–145)
Total Bilirubin: 0.8 mg/dL (ref 0.3–1.2)
Total Protein: 7.6 g/dL (ref 6.5–8.1)

## 2020-03-01 LAB — SARS CORONAVIRUS 2 AG (30 MIN TAT): SARS Coronavirus 2 Ag: POSITIVE — AB

## 2020-03-01 MED ORDER — ONDANSETRON 8 MG PO TBDP
8.0000 mg | ORAL_TABLET | Freq: Two times a day (BID) | ORAL | 0 refills | Status: DC
Start: 2020-03-01 — End: 2020-06-14

## 2020-03-01 MED ORDER — SODIUM CHLORIDE 0.9 % IV BOLUS
1000.0000 mL | Freq: Once | INTRAVENOUS | Status: AC
  Administered 2020-03-01: 1000 mL via INTRAVENOUS

## 2020-03-01 MED ORDER — ONDANSETRON 8 MG PO TBDP
8.0000 mg | ORAL_TABLET | Freq: Once | ORAL | Status: AC
  Administered 2020-03-01: 8 mg via ORAL

## 2020-03-01 NOTE — ED Triage Notes (Signed)
Pt c/o vomiting, diarrhea, fever (103), shortness of breath, chills, weakness, body aches. Started about a week ago.

## 2020-03-01 NOTE — Discharge Instructions (Addendum)
If you worsen or not improving go immediately to the emergency room.  Have set up an appointment for you to receive remdesivir through an infusion.  That clinic will contact you in the next day or so to arrange a time.  Prior to drink as much liquids as possible.  Use the Zofran for your nausea.  Get plenty of rest.

## 2020-03-01 NOTE — Telephone Encounter (Signed)
Received referral on patient from [facility] for Monoclonal antibody therapy, since she has tested positive for COVID today.  She is 71 with mild intermittent asthma.  Left patient a message to call back so we can discuss further.    Wilber Bihari, NP

## 2020-03-01 NOTE — ED Provider Notes (Signed)
[facility]    CSN: [id number] Arrival date & time: 03/01/20  1407      History   Chief Complaint Chief Complaint  Patient presents with   Emesis    HPI Cynthia Fisher is a 64 y.o. female.   HPI  64 year old female who appears acutely ill presents with fever up upwards of 103, shortness of breath, chills, weakness, body aches started about a week ago.  She developed diarrhea and vomiting approximately 3 days ago.  States that she has had very little fluid intake due to vomiting each time she drinks something.  She has not had her Covid vaccines.  Vital signs temperature 98.3 pulse of 126 respirations 20 O2 sats on room air 94%.       Past Medical History:  Diagnosis Date   Asthma    Cancer ([place])    Ear pain    Hypercholesteremia    Restless legs    Seizures ([facility])     Patient Active Problem List   Diagnosis Date Noted   Acute bronchitis 08/07/2017    Past Surgical History:  Procedure Laterality Date   ABDOMINAL HYSTERECTOMY     CHOLECYSTECTOMY     INNER EAR SURGERY Right    NASAL SINUS SURGERY      OB History   No obstetric history on file.      Home Medications    Prior to Admission medications   Medication Sig Start Date End Date Taking? Authorizing Provider  albuterol (PROVENTIL HFA;VENTOLIN HFA) 108 (90 Base) MCG/ACT inhaler Inhale 1-2 puffs into the lungs every 6 (six) hours as needed for wheezing or shortness of breath. 12/02/18  Yes Conty, [place], MD  amphetamine-dextroamphetamine (ADDERALL) 20 MG tablet Take 20 mg by mouth daily.   Yes [provider]  fluticasone (FLONASE) 50 MCG/ACT nasal spray Place 2 sprays into both nostrils daily. 10/27/17  Yes Lorin Picket, PA-C  ketoconazole (NIZORAL) 2 % cream Apply 1 application topically daily. Applied to ears and neck 05/30/18  Yes Lorin Picket, PA-C  ketoconazole (NIZORAL) 2 % shampoo Apply 1 application topically 2 (two) times a week. 05/30/18  Yes Lorin Picket, PA-C  lubiprostone (AMITIZA) 8 MCG capsule Take 8 mcg by mouth 2 (two) times daily with a meal.   Yes [provider]  simvastatin (ZOCOR) 20 MG tablet Take 20 mg by mouth daily.   Yes [provider]  topiramate (TOPAMAX) 100 MG tablet Take 100 mg by mouth 2 (two) times daily.   Yes [provider]  ondansetron (ZOFRAN ODT) 8 MG disintegrating tablet Take 1 tablet (8 mg total) by mouth 2 (two) times daily. 03/01/20   Lorin Picket, PA-C  metoprolol tartrate (LOPRESSOR) 100 MG tablet Take 1 tablet (100 mg) by mouth x 1 dose, 2 hours prior to your Cardiac CT 12/08/19 03/01/20  Kate Sable, MD  venlafaxine XR (EFFEXOR-XR) 150 MG 24 hr capsule Take 150 mg by mouth daily with breakfast.  03/01/20  [provider]    Family History Family History  Problem Relation Age of Onset   COPD Mother    Heart disease Father    Hypertension Father    Diabetes Father    Cancer Father     Social History Social History   Tobacco Use   Smoking status: Never Smoker   Smokeless tobacco: Never Used  Substance Use Topics   Alcohol use: No   Drug use: No     Allergies  Patient has no known allergies.   Review of Systems Review of Systems  Constitutional: Positive for activity change, appetite change, chills, fatigue and fever. Negative for diaphoresis.  Respiratory: Positive for cough and shortness of breath.   Gastrointestinal: Positive for diarrhea, nausea and vomiting.  Musculoskeletal: Positive for myalgias.  All other systems reviewed and are negative.    Physical Exam Triage Vital Signs ED Triage Vitals  Enc Vitals Group     BP 03/01/20 1427 107/75     Pulse Rate 03/01/20 1427 (!) 126     Resp 03/01/20 1427 20     Temp 03/01/20 1427 98.3 F (36.8 C)     Temp Source 03/01/20 1427 Oral     SpO2 03/01/20 1427 94 %     Weight 03/01/20 1422 137 lb 9.1 oz (62.4 kg)     Height 03/01/20 1422 5' (1.524 m)     Head  Circumference --      Peak Flow --      Pain Score 03/01/20 1422 9     Pain Loc --      Pain Edu? --      Excl. in [place]? --    No data found.  Updated Vital Signs BP 107/75 (BP Location: Left Arm)    Pulse (!) 126    Temp 98.3 F (36.8 C) (Oral)    Resp 20    Ht 5' (1.524 m)    Wt 137 lb 9.1 oz (62.4 kg)    SpO2 94%    BMI 26.87 kg/m   Visual Acuity Right Eye Distance:   Left Eye Distance:   Bilateral Distance:    Right Eye Near:   Left Eye Near:    Bilateral Near:     Physical Exam Vitals and nursing note reviewed.  Constitutional:      General: She is in acute distress.     Appearance: She is normal weight. She is ill-appearing. She is not toxic-appearing or diaphoretic.  HENT:     Head: Normocephalic and atraumatic.  Eyes:     Conjunctiva/sclera: Conjunctivae normal.  Cardiovascular:     Rate and Rhythm: Normal rate and regular rhythm.     Heart sounds: Normal heart sounds.  Pulmonary:     Breath sounds: Rales present.     Comments: She has crackles over the right middle lobe. Abdominal:     General: Abdomen is flat. There is no distension.     Palpations: Abdomen is soft.     Tenderness: There is no abdominal tenderness. There is no guarding or rebound.  Musculoskeletal:        General: Normal range of motion.     Cervical back: Normal range of motion and neck supple.  Skin:    General: Skin is warm and dry.  Neurological:     General: No focal deficit present.     Mental Status: She is alert and oriented to person, place, and time.  Psychiatric:        Mood and Affect: Mood normal.        Behavior: Behavior normal.        Thought Content: Thought content normal.        Judgment: Judgment normal.      UC Treatments / Results  Labs (all labs ordered are listed, but only abnormal results are displayed) Labs Reviewed  SARS CORONAVIRUS 2 AG (30 MIN TAT) - Abnormal; Notable for the following components:      Result Value  SARS Coronavirus 2 Ag POSITIVE  (*)    All other components within normal limits  CBC WITH DIFFERENTIAL/PLATELET - Abnormal; Notable for the following components:   RBC 5.51 (*)    Hemoglobin 16.0 (*)    HCT 46.3 (*)    All other components within normal limits  COMPREHENSIVE METABOLIC PANEL - Abnormal; Notable for the following components:   Sodium 134 (*)    Glucose, Bld 125 (*)    Calcium 8.7 (*)    All other components within normal limits    EKG   Radiology DG Chest 2 View  Result Date: 03/01/2020 CLINICAL DATA:  Cough, body aches, fever, and shortness of breath for the past week. EXAM: CHEST - 2 VIEW COMPARISON:  Chest x-ray dated November 10, 2019. FINDINGS: The heart size and mediastinal contours are within normal limits. Both lungs are clear. The visualized skeletal structures are unremarkable. Vagal nerve stimulator generator pack again noted. IMPRESSION: No active cardiopulmonary disease. Electronically Signed   By: Titus Dubin M.D.   On: 03/01/2020 15:12    Procedures Procedures (including critical care time)  Medications Ordered in UC Medications  ondansetron (ZOFRAN-ODT) disintegrating tablet 8 mg (8 mg Oral Given 03/01/20 1445)  sodium chloride 0.9 % bolus 1,000 mL (1,000 mLs Intravenous New Bag/Given 03/01/20 1637)    Initial Impression / Assessment and Plan / UC Course  I have reviewed the triage vital signs and the nursing notes.  Pertinent labs & imaging results that were available during my care of the patient were reviewed by me and considered in my medical decision making (see chart for details).   64 year old female presents today appearing acutely ill with vomiting diarrhea fever up to 103 shortness of breath chills weakness and body aches.  She states this started about a week ago.  Patient has not undergone Covid vaccines.  She looked ill but not toxic.  She states that the nausea vomiting diarrhea developed about 3 days ago.  Examination showed crackles in the mid right lobe area.   He was sent for x-rays which showed no active cardiopulmonary disease.  CBC CMP did not show any alarming abnormalities.  Her hemoglobin hematocrit were 16/46.3 respectively.  This was likely from her nausea vomiting and diarrhea.  She is not able to keep water down and was not eating.  Was given Zofran 8 mg ODT and quickly vomited that up.  A Covid test was obtained but a rapid test was also obtained confirming Covid positive.  Because of her dehydration she was given 1 L of normal saline.  She has a history of asthma and it was recommended that she be considered for remdesivir infusion.  This was called into the clinic and they will contact her tomorrow.  Patient did not wish to go to the hospital.  I recommended against her driving home and her grandson came to pick her up and take her home.  Given strict instructions to go to the emergency room if she worsened.  In the meantime she should use the Zofran as necessary so that she can tolerate at least fluids.  She should drink as much as she is able.  Did feel improvement after the 1 L of normal saline.   Final Clinical Impressions(s) / UC Diagnoses   Final diagnoses:  Nausea vomiting and diarrhea  COVID-19     Discharge Instructions     If you worsen or not improving go immediately to the emergency room.  Have set up an  appointment for you to receive remdesivir through an infusion.  That clinic will contact you in the next day or so to arrange a time.  Prior to drink as much liquids as possible.  Use the Zofran for your nausea.  Get plenty of rest.    ED Prescriptions    Medication Sig Dispense Auth. Provider   ondansetron (ZOFRAN ODT) 8 MG disintegrating tablet Take 1 tablet (8 mg total) by mouth 2 (two) times daily. 6 tablet Lorin Picket, PA-C     PDMP not reviewed this encounter.   Lorin Picket, PA-C 03/01/20 2143

## 2020-03-02 ENCOUNTER — Telehealth: Payer: Self-pay | Admitting: Unknown Physician Specialty

## 2020-03-02 ENCOUNTER — Telehealth: Payer: Self-pay | Admitting: Adult Health

## 2020-03-02 NOTE — Telephone Encounter (Signed)
Called to discuss with patient about Covid symptoms and the use of bamlanivimab, a monoclonal antibody infusion for those with mild to moderate Covid symptoms and at a high risk of hospitalization.  Pt is qualified for this infusion at the [facility] infusion center due to Age >55 with asthma   Message left to call back

## 2020-03-02 NOTE — Telephone Encounter (Signed)
Called and LMOM to call us back to discuss monoclonal antibody therapy for her COVID positivity.    Wilber Bihari, NP

## 2020-03-02 NOTE — Telephone Encounter (Signed)
Called to discuss with Cynthia Fisher about Covid symptoms and the use of bamlanivimab, a monoclonal antibody infusion for those with mild to moderate Covid symptoms and at a high risk of hospitalization.     Pt is qualified for this infusion but at this time, she has not been able to eat or drink any fluids.  She is light-headed and has a headache.  It is recommended she go to the ER for further management considering dehydration.  We can infuse tomorrow if she can be rehydrated and hemodynamically stable.

## 2020-03-03 ENCOUNTER — Telehealth: Payer: Self-pay | Admitting: Adult Health

## 2020-03-03 NOTE — Telephone Encounter (Signed)
Called and [facility] for patient to return our call about covid 19 and monoclonal antibody treatment.  Wilber Bihari, NP

## 2020-03-11 ENCOUNTER — Other Ambulatory Visit: Payer: Medicare Other

## 2020-03-15 ENCOUNTER — Ambulatory Visit: Payer: Medicare Other | Admitting: Cardiology

## 2020-06-14 ENCOUNTER — Other Ambulatory Visit: Payer: Self-pay

## 2020-06-14 ENCOUNTER — Ambulatory Visit (INDEPENDENT_AMBULATORY_CARE_PROVIDER_SITE_OTHER): Payer: Medicare Other

## 2020-06-14 ENCOUNTER — Ambulatory Visit
Admission: EM | Admit: 2020-06-14 | Discharge: 2020-06-14 | Disposition: A | Discharge status: Home / Self Care | Payer: Medicare Other | Attending: Emergency Medicine | Admitting: Emergency Medicine

## 2020-06-14 DIAGNOSIS — E7800 Pure hypercholesterolemia, unspecified: Secondary | ICD-10-CM | POA: Insufficient documentation

## 2020-06-14 DIAGNOSIS — Z20822 Contact with and (suspected) exposure to covid-19: Secondary | ICD-10-CM | POA: Diagnosis not present

## 2020-06-14 DIAGNOSIS — Z791 Long term (current) use of non-steroidal anti-inflammatories (NSAID): Secondary | ICD-10-CM | POA: Diagnosis not present

## 2020-06-14 DIAGNOSIS — J45901 Unspecified asthma with (acute) exacerbation: Secondary | ICD-10-CM

## 2020-06-14 DIAGNOSIS — Z79899 Other long term (current) drug therapy: Secondary | ICD-10-CM | POA: Insufficient documentation

## 2020-06-14 DIAGNOSIS — J0140 Acute pansinusitis, unspecified: Secondary | ICD-10-CM | POA: Diagnosis not present

## 2020-06-14 DIAGNOSIS — Z7952 Long term (current) use of systemic steroids: Secondary | ICD-10-CM | POA: Diagnosis not present

## 2020-06-14 DIAGNOSIS — G2581 Restless legs syndrome: Secondary | ICD-10-CM | POA: Diagnosis not present

## 2020-06-14 DIAGNOSIS — Z9049 Acquired absence of other specified parts of digestive tract: Secondary | ICD-10-CM | POA: Diagnosis not present

## 2020-06-14 LAB — CBC WITH DIFFERENTIAL/PLATELET
Abs Immature Granulocytes: 0.02 10*3/uL (ref 0.00–0.07)
Basophils Absolute: 0 10*3/uL (ref 0.0–0.1)
Basophils Relative: 1 %
Eosinophils Absolute: 0.1 10*3/uL (ref 0.0–0.5)
Eosinophils Relative: 1 %
HCT: 42.5 % (ref 36.0–46.0)
Hemoglobin: 14.3 g/dL (ref 12.0–15.0)
Immature Granulocytes: 0 %
Lymphocytes Relative: 35 %
Lymphs Abs: 1.8 10*3/uL (ref 0.7–4.0)
MCH: 30.3 pg (ref 26.0–34.0)
MCHC: 33.6 g/dL (ref 30.0–36.0)
MCV: 90 fL (ref 80.0–100.0)
Monocytes Absolute: 0.5 10*3/uL (ref 0.1–1.0)
Monocytes Relative: 9 %
Neutro Abs: 2.7 10*3/uL (ref 1.7–7.7)
Neutrophils Relative %: 54 %
Platelets: 237 10*3/uL (ref 150–400)
RBC: 4.72 MIL/uL (ref 3.87–5.11)
RDW: 13.2 % (ref 11.5–15.5)
WBC: 5.1 10*3/uL (ref 4.0–10.5)
nRBC: 0 % (ref 0.0–0.2)

## 2020-06-14 LAB — COMPREHENSIVE METABOLIC PANEL
ALT: 17 U/L (ref 0–44)
AST: 18 U/L (ref 15–41)
Albumin: 4 g/dL (ref 3.5–5.0)
Alkaline Phosphatase: 60 U/L (ref 38–126)
Anion gap: 7 (ref 5–15)
BUN: 12 mg/dL (ref 8–23)
CO2: 26 mmol/L (ref 22–32)
Calcium: 8.9 mg/dL (ref 8.9–10.3)
Chloride: 104 mmol/L (ref 98–111)
Creatinine, Ser: 0.65 mg/dL (ref 0.44–1.00)
GFR calc Af Amer: >60 mL/min (ref >60)
GFR calc non Af Amer: >60 mL/min (ref >60)
Glucose, Bld: 103 mg/dL — ABNORMAL HIGH (ref 70–99)
Potassium: 3.6 mmol/L (ref 3.5–5.1)
Sodium: 137 mmol/L (ref 135–145)
Total Bilirubin: 0.6 mg/dL (ref 0.3–1.2)
Total Protein: 7.6 g/dL (ref 6.5–8.1)

## 2020-06-14 LAB — SARS CORONAVIRUS 2 (TAT 6-24 HRS): SARS Coronavirus 2: NEGATIVE

## 2020-06-14 LAB — BRAIN NATRIURETIC PEPTIDE: B Natriuretic Peptide: 30.1 pg/mL (ref 0.0–100.0)

## 2020-06-14 MED ORDER — IPRATROPIUM-ALBUTEROL 0.5-2.5 (3) MG/3ML IN SOLN
3.0000 mL | Freq: Once | RESPIRATORY_TRACT | Status: AC
  Administered 2020-06-14: 3 mL via RESPIRATORY_TRACT

## 2020-06-14 MED ORDER — BENZONATATE 200 MG PO CAPS: 200.0000 mg | ORAL_CAPSULE | Freq: Three times a day (TID) | ORAL | 0 refills | Status: AC | PRN

## 2020-06-14 MED ORDER — PREDNISONE 50 MG PO TABS
50.0000 mg | ORAL_TABLET | Freq: Every day | ORAL | 0 refills | Status: DC
Start: 2020-06-14 — End: 2021-09-22

## 2020-06-14 MED ORDER — IBUPROFEN 600 MG PO TABS: 600.0000 mg | ORAL_TABLET | Freq: Four times a day (QID) | ORAL | 0 refills | Status: AC | PRN

## 2020-06-14 MED ORDER — AMOXICILLIN-POT CLAVULANATE 875-125 MG PO TABS
1.0000 | ORAL_TABLET | Freq: Two times a day (BID) | ORAL | 0 refills | Status: DC
Start: 2020-06-14 — End: 2021-09-22

## 2020-06-14 MED ORDER — ALBUTEROL SULFATE HFA 108 (90 BASE) MCG/ACT IN AERS: 1.0000 | INHALATION_SPRAY | RESPIRATORY_TRACT | 0 refills | Status: DC | PRN

## 2020-06-14 MED ORDER — AEROCHAMBER PLUS MISC: 2 refills | Status: AC

## 2020-06-14 NOTE — ED Triage Notes (Signed)
Patient states that she has been having shortness of breath and cough x 3 days. States that she had Covid in April. States that she has no taste but she can smell. States that she has noticed a decreased appetite. States that she has asthma at baseline.

## 2020-06-14 NOTE — Discharge Instructions (Addendum)
Take 2 puffs from your albuterol inhaler every 4 hours for 2 days, then every 6 hours for 2 days, then as needed.  Make sure you use your spacer when you use your albuterol inhaler.  Prednisone for asthma exacerbation.  Finish the Augmentin, even if you feel better, for your sinus infection.  Saline nasal irrigation with a Milta Deiters med sinus rinse and distilled water as often as you want.  Flonase, Mucinex, ibuprofen/1000 mg of Tylenol as needed for body aches, headaches, fevers.  I will contact you only if your BNP comes back abnormal, or if your Covid test comes back positive.

## 2020-06-14 NOTE — ED Provider Notes (Signed)
HPI  SUBJECTIVE:  Cynthia Fisher is a 64 y.o. female who presents with 3 days of malaise, fevers T-max 103, cough occasionally productive of thick yellow-green phlegm, shortness of breath, wheezing, dyspnea on exertion of 5 to 10 feet.  She states her chest feels tight and sore from coughing.  She reports loss of taste, but no loss of smell.  She reports nausea and multiple episodes of vomiting for the past 2 days.  1 loose bowel movement this morning.  She has not tried anything to eat or drink today.  She states that she is hungry and thirsty.  Denies abdominal pain.  She reports headaches, green-yellow nasal congestion,  sinus pain and pressure, postnasal drip.  No body aches.  No known exposure to Covid but states that she recently attended several large events.  She does not have a pulse oximeter at home.  She did not get the Covid vaccine yet.  No antibiotics in the past 3 months.  No antipyretic in the past 6 hours.  She has been doing her albuterol nebs every 4 hours with mild, but not complete improvement in her symptoms.  She has also been trying steam baths.  Symptoms are worse with movement and exertion.  She denies unintentional weight gain, lower extremity edema, nocturia.  States that she is sleeping in a recliner as she gets short of breath when she lies flat.  She states that this does not feel like Covid.  She has a past medical history of Covid in April 2021, asthma, last admitted for asthma exacerbation 2 or 3 years ago.  No intubations or recent steroids.  She has a history of seizures pneumonia x2,  frequent sinusitis.  No history of diabetes, hypertension, chronic kidney disease, hypercholesterolemia, CHF, MI, coronary artery disease.  PMD: None.   Past Medical History:  Diagnosis Date  . Asthma   . Cancer ([place])   . Ear pain   . Hypercholesteremia   . Restless legs   . Seizures ([place])     Past Surgical History:  Procedure Laterality Date  . ABDOMINAL HYSTERECTOMY    .  CHOLECYSTECTOMY    . INNER EAR SURGERY Right   . NASAL SINUS SURGERY      Family History  Problem Relation Age of Onset  . COPD Mother   . Heart disease Father   . Hypertension Father   . Diabetes Father   . Cancer Father     Social History   Tobacco Use  . Smoking status: Never Smoker  . Smokeless tobacco: Never Used  Vaping Use  . Vaping Use: Never used  Substance Use Topics  . Alcohol use: No  . Drug use: No    No current facility-administered medications for this encounter.  Current Outpatient Medications:  .  amphetamine-dextroamphetamine (ADDERALL) 20 MG tablet, Take 20 mg by mouth daily., Disp: , Rfl:  .  ketoconazole (NIZORAL) 2 % cream, Apply 1 application topically daily. Applied to ears and neck, Disp: 15 g, Rfl: 0 .  ketoconazole (NIZORAL) 2 % shampoo, Apply 1 application topically 2 (two) times a week., Disp: 120 mL, Rfl: 0 .  lubiprostone (AMITIZA) 8 MCG capsule, Take 8 mcg by mouth 2 (two) times daily with a meal., Disp: , Rfl:  .  simvastatin (ZOCOR) 20 MG tablet, Take 20 mg by mouth daily., Disp: , Rfl:  .  topiramate (TOPAMAX) 100 MG tablet, Take 100 mg by mouth 2 (two) times daily., Disp: , Rfl:  .  albuterol (VENTOLIN HFA) 108 (90 Base) MCG/ACT inhaler, Inhale 1-2 puffs into the lungs every 4 (four) hours as needed for wheezing or shortness of breath., Disp: 18 g, Rfl: 0 .  amoxicillin-clavulanate (AUGMENTIN) 875-125 MG tablet, Take 1 tablet by mouth 2 (two) times daily. X 7 days, Disp: 14 tablet, Rfl: 0 .  benzonatate (TESSALON) 200 MG capsule, Take 1 capsule (200 mg total) by mouth 3 (three) times daily as needed for cough., Disp: 30 capsule, Rfl: 0 .  ibuprofen (ADVIL) 600 MG tablet, Take 1 tablet (600 mg total) by mouth every 6 (six) hours as needed., Disp: 30 tablet, Rfl: 0 .  predniSONE (DELTASONE) 50 MG tablet, Take 1 tablet (50 mg total) by mouth daily with breakfast., Disp: 5 tablet, Rfl: 0 .  Spacer/Aero-Holding Chambers (AEROCHAMBER PLUS)  inhaler, Use as instructed, Disp: 1 each, Rfl: 2  No Known Allergies   ROS  As noted in HPI.   Physical Exam  BP (!) 143/100 (BP Location: Left Arm)   Pulse (!) 107   Temp 97.9 F (36.6 C) (Oral)   Resp (!) 22   Ht 5' (1.524 m)   Wt 63.5 kg   SpO2 100%   BMI 27.34 kg/m   Constitutional: Well developed, well nourished, speaking in short sentences. Eyes:  EOMI, conjunctiva normal bilaterally HENT: Normocephalic, atraumatic,mucus membranes moist positive nasal congestion.  Positive maxillary and frontal sinus tenderness.  No obvious postnasal drip. Respiratory: Increased inspiratory effort, diffuse expiratory wheezing throughout all lung fields.  Decreased breath sounds bilateral bases.  Lateral chest wall tenderness Cardiovascular: Regular tachycardia no murmurs rubs or gallops GI: nondistended, positive mild upper abdominal tenderness.  No appreciable hepatomegaly. skin: No lower extremity edema, calves symmetric, nontender. Musculoskeletal: no deformities Neurologic: Alert & oriented x 3, no focal neuro deficits Psychiatric: Speech and behavior appropriate   ED Course   Medications  ipratropium-albuterol (DUONEB) 0.5-2.5 (3) MG/3ML nebulizer solution 3 mL (3 mLs Nebulization Given 06/14/20 1405)    Orders Placed This Encounter  Procedures  . SARS CORONAVIRUS 2 (TAT 6-24 HRS) Nasopharyngeal Nasopharyngeal Swab    Standing Status:   Standing    Number of Occurrences:   1    Order Specific Question:   Is this test for diagnosis or screening    Answer:   Diagnosis of ill patient    Order Specific Question:   Symptomatic for COVID-19 as defined by CDC    Answer:   Yes    Order Specific Question:   Date of Symptom Onset    Answer:   06/11/2020    Order Specific Question:   Hospitalized for COVID-19    Answer:   No    Order Specific Question:   Admitted to ICU for COVID-19    Answer:   No    Order Specific Question:   Previously tested for COVID-19    Answer:   Yes     Order Specific Question:   Resident in a congregate (group) care setting    Answer:   No    Order Specific Question:   Employed in healthcare setting    Answer:   No    Order Specific Question:   Pregnant    Answer:   No    Order Specific Question:   Has patient completed COVID vaccination(s) (2 doses of Pfizer/Moderna 1 dose of [employer])    Answer:   No  . DG Chest 2 View    Standing Status:   Standing  Number of Occurrences:   1    Order Specific Question:   Reason for Exam (SYMPTOM  OR DIAGNOSIS REQUIRED)    Answer:   cough fever r/o pna pulm edema effusion  . CBC with Differential    Standing Status:   Standing    Number of Occurrences:   1  . Brain natriuretic peptide    Standing Status:   Standing    Number of Occurrences:   1  . Comprehensive metabolic panel    Standing Status:   Standing    Number of Occurrences:   1    Results for orders placed or performed during the hospital encounter of 06/14/20 (from the past 24 hour(s))  SARS CORONAVIRUS 2 (TAT 6-24 HRS) Nasopharyngeal Nasopharyngeal Swab     Status: None   Collection Time: 06/14/20 12:03 PM   Specimen: Nasopharyngeal Swab  Result Value Ref Range   SARS Coronavirus 2 NEGATIVE NEGATIVE  CBC with Differential     Status: None   Collection Time: 06/14/20  1:42 PM  Result Value Ref Range   WBC 5.1 4.0 - 10.5 K/uL   RBC 4.72 3.87 - 5.11 MIL/uL   Hemoglobin 14.3 12.0 - 15.0 g/dL   HCT 42.5 36 - 46 %   MCV 90.0 80.0 - 100.0 fL   MCH 30.3 26.0 - 34.0 pg   MCHC 33.6 30.0 - 36.0 g/dL   RDW 13.2 11.5 - 15.5 %   Platelets 237 150 - 400 K/uL   nRBC 0.0 0.0 - 0.2 %   Neutrophils Relative % 54 %   Neutro Abs 2.7 1.7 - 7.7 K/uL   Lymphocytes Relative 35 %   Lymphs Abs 1.8 0.7 - 4.0 K/uL   Monocytes Relative 9 %   Monocytes Absolute 0.5 0 - 1 K/uL   Eosinophils Relative 1 %   Eosinophils Absolute 0.1 0 - 0 K/uL   Basophils Relative 1 %   Basophils Absolute 0.0 0 - 0 K/uL   Immature Granulocytes 0 %   Abs  Immature Granulocytes 0.02 0.00 - 0.07 K/uL  Brain natriuretic peptide     Status: None   Collection Time: 06/14/20  1:43 PM  Result Value Ref Range   B Natriuretic Peptide 30.1 0.0 - 100.0 pg/mL  Comprehensive metabolic panel     Status: Abnormal   Collection Time: 06/14/20  1:49 PM  Result Value Ref Range   Sodium 137 135 - 145 mmol/L   Potassium 3.6 3.5 - 5.1 mmol/L   Chloride 104 98 - 111 mmol/L   CO2 26 22 - 32 mmol/L   Glucose, Bld 103 (H) 70 - 99 mg/dL   BUN 12 8 - 23 mg/dL   Creatinine, Ser 0.65 0.44 - 1.00 mg/dL   Calcium 8.9 8.9 - 10.3 mg/dL   Total Protein 7.6 6.5 - 8.1 g/dL   Albumin 4.0 3.5 - 5.0 g/dL   AST 18 15 - 41 U/L   ALT 17 0 - 44 U/L   Alkaline Phosphatase 60 38 - 126 U/L   Total Bilirubin 0.6 0.3 - 1.2 mg/dL   GFR calc non Af Amer >60 >60 mL/min   GFR calc Af Amer >60 >60 mL/min   Anion gap 7 5 - 15   DG Chest 2 View  Result Date: 06/14/2020 CLINICAL DATA:  Cough and fever. Evaluate for pneumonia and pulmonary edema. EXAM: CHEST - 2 VIEW COMPARISON:  Radiographs 03/01/2020 and 11/10/2019. FINDINGS: The heart size and mediastinal contours are stable.  The lungs are clear. There is no pleural effusion or pneumothorax. Generator pack overlies the anterior left chest wall, reportedly a vagal nerve stimulator. Cholecystectomy clips are noted. There is a mild thoracolumbar scoliosis. IMPRESSION: Stable chest.  No active cardiopulmonary process. Electronically Signed   By: Richardean Sale M.D.   On: 06/14/2020 14:02    ED Clinical Impression  1. Moderate asthma with acute exacerbation, unspecified whether persistent   2. Acute non-recurrent pansinusitis      ED Assessment/Plan  Patient has audible expiratory wheezing.   given history of fevers, with wheezing and shortness of breath, favor  pneumonia or sinusitis because of her sinus tenderness but may also have an asthma exacerbation on top of this.  Doubt repeat Covid infection as she had it f4 months ago.   Patient is working hard to breathe, but satting 100% on room air.  Think that we can improve this and avoid transfer to the ER with a DuoNeb.  CBC, CMP, chest x-ray, BNP sent.  Covid sent.  Reviewed imaging independently.  Stable chest.  No acute cardiopulmonary disease.  See radiology report for full details.  On reevaluation, patient states she feels significantly better.  Significantly improved air movement, lateral wheezing throughout all lung fields.  She is speaking in full sentences and appears more comfortable.  CBC, CMP, chest x-ray normal.  Suspect sinusitis as cause of her fever with an asthma exacerbation given reassuring labs and imaging.  Home with regularly scheduled albuterol-2 puffs every 4 hours for 2 days, 2 puffs every 6 hours for 2 days, then as needed.  She is to use her spacer with her albuterol inhaler.  5 days of prednisone 50 mg Augmentin, saline nasal irrigation, Tessalon.  Follow-up with PMD if not better in 2 or 3 days, strict ER return precautions given.  We will contact patient at [phone] only if BNP is abnormal or Covid positive.  BNP, Covid negative.  Presentation consistent with sinusitis and asthma exacerbation.  Discussed labs, imaging, MDM, treatment plan, and plan for follow-up with patient. Discussed sn/sx that should prompt return to the ED. patient agrees with plan.   Meds ordered this encounter  Medications  . ipratropium-albuterol (DUONEB) 0.5-2.5 (3) MG/3ML nebulizer solution 3 mL  . albuterol (VENTOLIN HFA) 108 (90 Base) MCG/ACT inhaler    Sig: Inhale 1-2 puffs into the lungs every 4 (four) hours as needed for wheezing or shortness of breath.    Dispense:  18 g    Refill:  0  . Spacer/Aero-Holding Chambers (AEROCHAMBER PLUS) inhaler    Sig: Use as instructed    Dispense:  1 each    Refill:  2  . benzonatate (TESSALON) 200 MG capsule    Sig: Take 1 capsule (200 mg total) by mouth 3 (three) times daily as needed for cough.    Dispense:   30 capsule    Refill:  0  . ibuprofen (ADVIL) 600 MG tablet    Sig: Take 1 tablet (600 mg total) by mouth every 6 (six) hours as needed.    Dispense:  30 tablet    Refill:  0  . amoxicillin-clavulanate (AUGMENTIN) 875-125 MG tablet    Sig: Take 1 tablet by mouth 2 (two) times daily. X 7 days    Dispense:  14 tablet    Refill:  0  . predniSONE (DELTASONE) 50 MG tablet    Sig: Take 1 tablet (50 mg total) by mouth daily with breakfast.    Dispense:  5  tablet    Refill:  0    *This clinic note was created using [employer]. Therefore, there may be occasional mistakes despite careful proofreading.   ?    Melynda Ripple, MD 06/15/20 [phone]

## 2020-08-24 ENCOUNTER — Other Ambulatory Visit: Payer: Medicare Other

## 2020-08-27 ENCOUNTER — Ambulatory Visit: Payer: Medicare Other | Admitting: Cardiology

## 2021-01-03 ENCOUNTER — Ambulatory Visit: Payer: Self-pay | Admitting: *Deleted

## 2021-01-03 NOTE — Telephone Encounter (Signed)
Pt reports productive cough x 2 weeks. Treating with OTC meds, now worsening. Coughing up greenish phlegm.  . Reports SOB, CP, wheezing. Audible wheezing during call. Fever "Off and on."  Pt speaking in short phrases, coughing. Directed to ED; states will go now, have mother drive. Care advise given. Pt has new patient appt in May.   Reason for Disposition . [1] MODERATE difficulty breathing (e.g., speaks in phrases, SOB even at rest, pulse 100-120) AND [2] still present when not coughing  Answer Assessment - Initial Assessment Questions 1. ONSET: "When did the cough begin?"      2 weeks ago 2. SEVERITY: "How bad is the cough today?"      severe 3. SPUTUM: "Describe the color of your sputum" (none, dry cough; clear, white, yellow, green)    greenish 4. HEMOPTYSIS: "Are you coughing up any blood?" If so ask: "How much?" (flecks, streaks, tablespoons, etc.)     no 5. DIFFICULTY BREATHING: "Are you having difficulty breathing?" If Yes, ask: "How bad is it?" (e.g., mild, moderate, severe)    - MILD: No SOB at rest, mild SOB with walking, speaks normally in sentences, can lay down, no retractions, pulse < 100.    - MODERATE: SOB at rest, SOB with minimal exertion and prefers to sit, cannot lie down flat, speaks in phrases, mild retractions, audible wheezing, pulse 100-120.    - SEVERE: Very SOB at rest, speaks in single words, struggling to breathe, sitting hunched forward, retractions, pulse > 120      SOB at times, nebs often 6. FEVER: "Do you have a fever?" If Yes, ask: "What is your temperature, how was it measured, and when did it start?"     Not today, comes and goes 7. CARDIAC HISTORY: "Do you have any history of heart disease?" (e.g., heart attack, congestive heart failure)      *No Answer* 8. LUNG HISTORY: "Do you have any history of lung disease?"  (e.g., pulmonary embolus, asthma, emphysema)    wheezing 9. PE RISK FACTORS: "Do you have a history of blood clots?" (or: recent major  surgery, recent prolonged travel, bedridden)      10. OTHER SYMPTOMS: "Do you have any other symptoms?" (e.g., runny nose, wheezing, chest pain)       Wheezing, chest pain when coughing.  Protocols used: COUGH - ACUTE PRODUCTIVE-A-AH

## 2021-02-10 ENCOUNTER — Telehealth: Payer: Self-pay

## 2021-02-10 NOTE — Telephone Encounter (Addendum)
Patient would like to know if Dr. Ronnald Ramp would refill the following medications:   topiramate (TOPAMAX) 100 MG tablet ,  amphetamine-dextroamphetamine (ADDERALL) 20 MG tablet ,  pramipexole 1mg  twice daily    Prior to her 03/17/2021 New Patient appointment. Advised patient to call prescribing doctor and request refill patient states her former PCP is out of network and requiring appointment. Also if there's any cancellations prior to NPA, patient would like a work in.  Patient would like a follow up call best # [phone]      [id number], [place] Phone:  [phone]  Fax:  [phone]

## 2021-02-11 NOTE — Telephone Encounter (Signed)
Patient would like to speak to the doctor regarding a refill on her seizure medication.  She is all out and would like it filled until her appt. In May.  Please advise and call patient today if possible.

## 2021-02-11 NOTE — Telephone Encounter (Signed)
Informed patient that we do not prescribed long term use of medicines for sleep, anxiety or pain and that the provider could possibly refer her out to a specialist for some of  her medications. The patient stated that she did not think to come in to see Dr Ronnald Ramp if Dr Ronnald Ramp could not see her for the medicines that she currently needs. She also stated that she will continue to look into other providers and contact her insurance if she needs to make any changes with her PCP.

## 2021-02-15 DIAGNOSIS — R69 Illness, unspecified: Secondary | ICD-10-CM | POA: Diagnosis not present

## 2021-02-15 DIAGNOSIS — K5904 Chronic idiopathic constipation: Secondary | ICD-10-CM | POA: Diagnosis not present

## 2021-02-15 DIAGNOSIS — E782 Mixed hyperlipidemia: Secondary | ICD-10-CM | POA: Diagnosis not present

## 2021-02-15 DIAGNOSIS — D519 Vitamin B12 deficiency anemia, unspecified: Secondary | ICD-10-CM | POA: Diagnosis not present

## 2021-02-15 DIAGNOSIS — R030 Elevated blood-pressure reading, without diagnosis of hypertension: Secondary | ICD-10-CM | POA: Diagnosis not present

## 2021-02-15 DIAGNOSIS — Z6827 Body mass index (BMI) 27.0-27.9, adult: Secondary | ICD-10-CM | POA: Diagnosis not present

## 2021-03-17 ENCOUNTER — Ambulatory Visit: Payer: Self-pay | Admitting: Family Medicine

## 2021-03-21 DIAGNOSIS — Z20822 Contact with and (suspected) exposure to covid-19: Secondary | ICD-10-CM | POA: Diagnosis not present

## 2021-03-21 DIAGNOSIS — R059 Cough, unspecified: Secondary | ICD-10-CM | POA: Diagnosis not present

## 2021-03-31 ENCOUNTER — Ambulatory Visit: Payer: Self-pay | Admitting: Family Medicine

## 2021-05-17 DIAGNOSIS — E782 Mixed hyperlipidemia: Secondary | ICD-10-CM | POA: Diagnosis not present

## 2021-05-17 DIAGNOSIS — R69 Illness, unspecified: Secondary | ICD-10-CM | POA: Diagnosis not present

## 2021-05-17 DIAGNOSIS — R030 Elevated blood-pressure reading, without diagnosis of hypertension: Secondary | ICD-10-CM | POA: Diagnosis not present

## 2021-05-17 DIAGNOSIS — Z0000 Encounter for general adult medical examination without abnormal findings: Secondary | ICD-10-CM | POA: Diagnosis not present

## 2021-05-17 DIAGNOSIS — Z6826 Body mass index (BMI) 26.0-26.9, adult: Secondary | ICD-10-CM | POA: Diagnosis not present

## 2021-05-17 DIAGNOSIS — K5900 Constipation, unspecified: Secondary | ICD-10-CM | POA: Diagnosis not present

## 2021-05-17 DIAGNOSIS — Z1331 Encounter for screening for depression: Secondary | ICD-10-CM | POA: Diagnosis not present

## 2021-05-17 DIAGNOSIS — F900 Attention-deficit hyperactivity disorder, predominantly inattentive type: Secondary | ICD-10-CM | POA: Diagnosis not present

## 2021-05-17 DIAGNOSIS — Z79899 Other long term (current) drug therapy: Secondary | ICD-10-CM | POA: Diagnosis not present

## 2021-05-17 DIAGNOSIS — Z1389 Encounter for screening for other disorder: Secondary | ICD-10-CM | POA: Diagnosis not present

## 2021-05-17 DIAGNOSIS — D519 Vitamin B12 deficiency anemia, unspecified: Secondary | ICD-10-CM | POA: Diagnosis not present

## 2021-06-23 DIAGNOSIS — R059 Cough, unspecified: Secondary | ICD-10-CM | POA: Diagnosis not present

## 2021-06-23 DIAGNOSIS — Z20822 Contact with and (suspected) exposure to covid-19: Secondary | ICD-10-CM | POA: Diagnosis not present

## 2021-07-07 DIAGNOSIS — Z6826 Body mass index (BMI) 26.0-26.9, adult: Secondary | ICD-10-CM | POA: Diagnosis not present

## 2021-07-07 DIAGNOSIS — B373 Candidiasis of vulva and vagina: Secondary | ICD-10-CM | POA: Diagnosis not present

## 2021-07-07 DIAGNOSIS — R69 Illness, unspecified: Secondary | ICD-10-CM | POA: Diagnosis not present

## 2021-07-25 DIAGNOSIS — Z1211 Encounter for screening for malignant neoplasm of colon: Secondary | ICD-10-CM | POA: Diagnosis not present

## 2021-08-02 DIAGNOSIS — U071 COVID-19: Secondary | ICD-10-CM | POA: Diagnosis not present

## 2021-08-02 DIAGNOSIS — J208 Acute bronchitis due to other specified organisms: Secondary | ICD-10-CM | POA: Diagnosis not present

## 2021-08-02 DIAGNOSIS — L918 Other hypertrophic disorders of the skin: Secondary | ICD-10-CM | POA: Diagnosis not present

## 2021-08-08 DIAGNOSIS — U071 COVID-19: Secondary | ICD-10-CM | POA: Diagnosis not present

## 2021-08-08 DIAGNOSIS — R062 Wheezing: Secondary | ICD-10-CM | POA: Diagnosis not present

## 2021-09-22 ENCOUNTER — Other Ambulatory Visit: Payer: Self-pay

## 2021-09-22 ENCOUNTER — Encounter: Payer: Self-pay | Admitting: Emergency Medicine

## 2021-09-22 ENCOUNTER — Ambulatory Visit (INDEPENDENT_AMBULATORY_CARE_PROVIDER_SITE_OTHER): Payer: Medicare HMO

## 2021-09-22 ENCOUNTER — Ambulatory Visit
Admission: EM | Admit: 2021-09-22 | Discharge: 2021-09-22 | Disposition: A | Discharge status: Home / Self Care | Payer: Medicare HMO | Attending: Internal Medicine | Admitting: Internal Medicine

## 2021-09-22 DIAGNOSIS — S80212A Abrasion, left knee, initial encounter: Secondary | ICD-10-CM

## 2021-09-22 DIAGNOSIS — M25562 Pain in left knee: Secondary | ICD-10-CM | POA: Diagnosis not present

## 2021-09-22 DIAGNOSIS — S80211A Abrasion, right knee, initial encounter: Secondary | ICD-10-CM

## 2021-09-22 DIAGNOSIS — L089 Local infection of the skin and subcutaneous tissue, unspecified: Secondary | ICD-10-CM

## 2021-09-22 DIAGNOSIS — S80219A Abrasion, unspecified knee, initial encounter: Secondary | ICD-10-CM

## 2021-09-22 DIAGNOSIS — S8002XA Contusion of left knee, initial encounter: Secondary | ICD-10-CM

## 2021-09-22 DIAGNOSIS — W19XXXA Unspecified fall, initial encounter: Secondary | ICD-10-CM

## 2021-09-22 DIAGNOSIS — M7989 Other specified soft tissue disorders: Secondary | ICD-10-CM | POA: Diagnosis not present

## 2021-09-22 DIAGNOSIS — S1081XA Abrasion of other specified part of neck, initial encounter: Secondary | ICD-10-CM

## 2021-09-22 NOTE — ED Triage Notes (Signed)
Pt presents today with c/o of left leg/knee pain with swelling s/p fall outside last evening. She also has abrasion to right knee and pain/swelling to chin.

## 2021-09-22 NOTE — ED Provider Notes (Signed)
[facility]    CSN: [id number] Arrival date & time: 09/22/21  1215      History   Chief Complaint Chief Complaint  Patient presents with   Fall   Leg Pain   Knee Pain    left    HPI Cynthia Fisher is a 65 y.o. female who presents with L knee pain and swelling since she fell outside last night. Has an abrasion on R knee and pain and swelling to her chin. Denies neck pain, HA or LOC. She states she has hx of vertigo and she turned to fast when she was emptying groceries from her car and when felt it. Thought she could catch herself but ended up falling forward and hitting her knees and face.     Past Medical History:  Diagnosis Date   Asthma    Cancer ([place])    Ear pain    Hypercholesteremia    Restless legs    Seizures ([facility])     Patient Active Problem List   Diagnosis Date Noted   Acute bronchitis 08/07/2017    Past Surgical History:  Procedure Laterality Date   ABDOMINAL HYSTERECTOMY     CHOLECYSTECTOMY     INNER EAR SURGERY Right    NASAL SINUS SURGERY      OB History   No obstetric history on file.      Home Medications    Prior to Admission medications   Medication Sig Start Date End Date Taking? Authorizing Provider  albuterol (VENTOLIN HFA) 108 (90 Base) MCG/ACT inhaler Inhale 1-2 puffs into the lungs every 4 (four) hours as needed for wheezing or shortness of breath. 06/14/20   Melynda Ripple, MD  amphetamine-dextroamphetamine (ADDERALL) 20 MG tablet Take 20 mg by mouth daily.    [provider]  benzonatate (TESSALON) 200 MG capsule Take 1 capsule (200 mg total) by mouth 3 (three) times daily as needed for cough. 06/14/20   Melynda Ripple, MD  ibuprofen (ADVIL) 600 MG tablet Take 1 tablet (600 mg total) by mouth every 6 (six) hours as needed. 06/14/20   Melynda Ripple, MD  ketoconazole (NIZORAL) 2 % cream Apply 1 application topically daily. Applied to ears and neck 05/30/18   Lorin Picket, PA-C  ketoconazole  (NIZORAL) 2 % shampoo Apply 1 application topically 2 (two) times a week. 05/30/18   Lorin Picket, PA-C  lubiprostone (AMITIZA) 8 MCG capsule Take 8 mcg by mouth 2 (two) times daily with a meal.    [provider]  simvastatin (ZOCOR) 20 MG tablet Take 20 mg by mouth daily.    [provider]  Spacer/Aero-Holding Chambers (AEROCHAMBER PLUS) inhaler Use as instructed 06/14/20   Melynda Ripple, MD  topiramate (TOPAMAX) 100 MG tablet Take 100 mg by mouth 2 (two) times daily.    [provider]  fluticasone (FLONASE) 50 MCG/ACT nasal spray Place 2 sprays into both nostrils daily. 10/27/17 06/14/20  Lorin Picket, PA-C  metoprolol tartrate (LOPRESSOR) 100 MG tablet Take 1 tablet (100 mg) by mouth x 1 dose, 2 hours prior to your Cardiac CT 12/08/19 03/01/20  Kate Sable, MD  venlafaxine XR (EFFEXOR-XR) 150 MG 24 hr capsule Take 150 mg by mouth daily with breakfast.  03/01/20  [provider]    Family History Family History  Problem Relation Age of Onset   COPD Mother    Heart disease Father    Hypertension Father    Diabetes Father    Cancer Father  Social History Social History   Tobacco Use   Smoking status: Never   Smokeless tobacco: Never  Vaping Use   Vaping Use: Never used  Substance Use Topics   Alcohol use: No   Drug use: No     Allergies   Patient has no known allergies.   Review of Systems Review of Systems  HENT:  Negative for dental problem and nosebleeds.   Eyes:  Negative for visual disturbance.  Musculoskeletal:  Positive for arthralgias and joint swelling.  Skin:  Positive for color change and wound.  Neurological:  Positive for headaches. Negative for syncope, weakness and numbness.    Physical Exam Triage Vital Signs ED Triage Vitals  Enc Vitals Group     BP      Pulse      Resp      Temp      Temp src      SpO2      Weight      Height      Head Circumference      Peak Flow      Pain Score       Pain Loc      Pain Edu?      Excl. in [place]?    No data found.  Updated Vital Signs BP 140/81 (BP Location: Right Arm)   Pulse 83   Temp 98.4 F (36.9 C) (Oral)   Resp 16   SpO2 97%   Visual Acuity Right Eye Distance:   Left Eye Distance:   Bilateral Distance:    Right Eye Near:   Left Eye Near:    Bilateral Near:     Physical Exam Vitals and nursing note reviewed.  Constitutional:      General: She is not in acute distress.    Appearance: She is not toxic-appearing.  HENT:     Head:     Comments: Has no bone tenderness on her chin, and teeth are intact. Has superficial linear abrasion on her chin which is clear and starting to scab    Right Ear: External ear normal.     Left Ear: External ear normal.     Nose:     Comments: Nose bridge is a little tender, but I dont note septal deviation.     Mouth/Throat:     Mouth: Mucous membranes are moist.     Pharynx: Oropharynx is clear.  Eyes:     General: No scleral icterus.    Extraocular Movements: Extraocular movements intact.     Conjunctiva/sclera: Conjunctivae normal.     Pupils: Pupils are equal, round, and reactive to light.  Musculoskeletal:     Cervical back: Neck supple. No rigidity or tenderness.     Comments: L KNEE-with effusion, superficial abrasion above the patella and ecchymosis forming. Is unable to fully straighten her knee and flexion is to 60 degrees before she feels pain and tightness. Specialty test not done due to pain. R KNEE- has superficial abrasion on the top of the patella area. ROM is normal   Lymphadenopathy:     Cervical: No cervical adenopathy.  Skin:    General: Skin is warm and dry.     Findings: Bruising present.  Neurological:     Mental Status: She is alert and oriented to person, place, and time.     Gait: Gait abnormal.  Psychiatric:        Mood and Affect: Mood normal.        Behavior: Behavior  normal.        Thought Content: Thought content normal.        Judgment: Judgment  normal.     UC Treatments / Results  Labs (all labs ordered are listed, but only abnormal results are displayed) Labs Reviewed - No data to display  EKG   Radiology DG Knee Complete 4 Views Left  Result Date: 09/22/2021 CLINICAL DATA:  Fall.  Left knee pain and swelling EXAM: LEFT KNEE - COMPLETE 4+ VIEW COMPARISON:  10/05/2006 FINDINGS: Negative for fracture.  There is a moderately large joint effusion. Tricompartmental degenerative change which has progressed in the interval. Degenerative change most severe in the lateral joint compartment with joint space narrowing and spurring. IMPRESSION: Moderate joint effusion.  Negative for fracture Progression of tricompartmental degenerative change. Electronically Signed   By: Franchot Gallo M.D.   On: 09/22/2021 14:24    Procedures Procedures (including critical care time)  Medications Ordered in UC Medications - No data to display  Initial Impression / Assessment and Plan / UC Course  I have reviewed the triage vital signs and the nursing notes. Pertinent  imaging results that were available during my care of the patient were reviewed by me and considered in my medical decision making (see chart for details). Has face contusion, L knee contusion and bilateral knee abrasions. Advised to continue icing, has Ibuprofen at home and may take that prn pain. Needs to Fu with PCP next week.   Final Clinical Impressions(s) / UC Diagnoses   Final diagnoses:  Knee abrasion, unspecified laterality, initial encounter  Face abrasion, infected, initial encounter  Fall, initial encounter  Contusion of left knee, initial encounter     Discharge Instructions      Continue icing areas of pain. Apply triple antibiotic ointment on the scrapes twice a day for 7 days      ED Prescriptions   None    PDMP not reviewed this encounter.   Shelby Mattocks, [place] 09/22/21 1638

## 2021-09-22 NOTE — Discharge Instructions (Signed)
Continue icing areas of pain. Apply triple antibiotic ointment on the scrapes twice a day for 7 days

## 2021-10-10 ENCOUNTER — Ambulatory Visit (INDEPENDENT_AMBULATORY_CARE_PROVIDER_SITE_OTHER): Payer: Medicare HMO

## 2021-10-10 ENCOUNTER — Other Ambulatory Visit: Payer: Self-pay

## 2021-10-10 ENCOUNTER — Encounter: Payer: Self-pay | Admitting: Licensed Clinical Social Worker

## 2021-10-10 ENCOUNTER — Ambulatory Visit
Admission: EM | Admit: 2021-10-10 | Discharge: 2021-10-10 | Disposition: A | Discharge status: Home / Self Care | Payer: Medicare HMO | Attending: Physician Assistant | Admitting: Physician Assistant

## 2021-10-10 DIAGNOSIS — B349 Viral infection, unspecified: Secondary | ICD-10-CM | POA: Insufficient documentation

## 2021-10-10 DIAGNOSIS — J45901 Unspecified asthma with (acute) exacerbation: Secondary | ICD-10-CM | POA: Diagnosis not present

## 2021-10-10 DIAGNOSIS — R051 Acute cough: Secondary | ICD-10-CM | POA: Insufficient documentation

## 2021-10-10 DIAGNOSIS — Z20822 Contact with and (suspected) exposure to covid-19: Secondary | ICD-10-CM | POA: Diagnosis not present

## 2021-10-10 DIAGNOSIS — R059 Cough, unspecified: Secondary | ICD-10-CM | POA: Diagnosis not present

## 2021-10-10 DIAGNOSIS — R0602 Shortness of breath: Secondary | ICD-10-CM

## 2021-10-10 DIAGNOSIS — E785 Hyperlipidemia, unspecified: Secondary | ICD-10-CM | POA: Diagnosis not present

## 2021-10-10 DIAGNOSIS — R062 Wheezing: Secondary | ICD-10-CM

## 2021-10-10 LAB — RESP PANEL BY RT-PCR (FLU A&B, COVID) ARPGX2
Influenza A by PCR: NEGATIVE
Influenza B by PCR: NEGATIVE
SARS Coronavirus 2 by RT PCR: NEGATIVE

## 2021-10-10 MED ORDER — METHYLPREDNISOLONE SODIUM SUCC 125 MG IJ SOLR
125.0000 mg | Freq: Once | INTRAMUSCULAR | Status: AC
  Administered 2021-10-10: 125 mg via INTRAMUSCULAR

## 2021-10-10 MED ORDER — ALBUTEROL SULFATE HFA 108 (90 BASE) MCG/ACT IN AERS
1.0000 | INHALATION_SPRAY | Freq: Four times a day (QID) | RESPIRATORY_TRACT | Status: DC | PRN
  Administered 2021-10-10: 2 via RESPIRATORY_TRACT

## 2021-10-10 MED ORDER — IPRATROPIUM-ALBUTEROL 0.5-2.5 (3) MG/3ML IN SOLN: 3.0000 mL | Freq: Four times a day (QID) | RESPIRATORY_TRACT | 0 refills | Status: DC | PRN

## 2021-10-10 MED ORDER — PROMETHAZINE-DM 6.25-15 MG/5ML PO SYRP: 5.0000 mL | ORAL_SOLUTION | Freq: Four times a day (QID) | ORAL | 0 refills | Status: AC | PRN

## 2021-10-10 MED ORDER — PREDNISONE 20 MG PO TABS: 40.0000 mg | ORAL_TABLET | Freq: Every day | ORAL | 0 refills | Status: AC

## 2021-10-10 NOTE — Discharge Instructions (Signed)
-  Your flu and COVID test were negative and your chest x-ray is negative for pneumonia.  I suspect you have a really bad cold and a flareup of her asthma.  I have sent cough medicine to the pharmacy.  We have given you a steroid injection in the clinic.  Start the prednisone tomorrow.  Increase rest and fluids.  I also sent a nebulizer solution as to different medicines in it.  Use this in place of the plain albuterol.  I think will be more helpful while you are sick.  If your breathing is not improved with the nebulizer medication or you are feeling worse you may need to go to the emergency department or call 911.  You should be feeling better over the next week.

## 2021-10-10 NOTE — ED Triage Notes (Signed)
Pt  presents with shortness of breath and wheezing. Sxs started last night. Pt states that she has a history of asthma.

## 2021-10-10 NOTE — ED Provider Notes (Signed)
[facility]    CSN: [id number] Arrival date & time: 10/10/21  1526      History   Chief Complaint Chief Complaint  Patient presents with   Shortness of Breath    wheezing    HPI Cynthia Fisher is a 65 y.o. female presenting for sudden onset of fatigue, body aches, cough, congestion, runny nose, increased shortness of breath and wheezing last night.  Patient reports her grandchildren have had runny noses too.  She does have history of asthma.  States she has done breathing treatments at home which have not really seem to help her breathing and wheezing.  She has not had any fevers.  Reports chest pressure.  No known COVID or flu exposure.  Other than her history of asthma she also has history of hyperlipidemia and seizures.  HPI  Past Medical History:  Diagnosis Date   Asthma    Cancer ([place])    Ear pain    Hypercholesteremia    Restless legs    Seizures ([place])     Patient Active Problem List   Diagnosis Date Noted   Acute bronchitis 08/07/2017    Past Surgical History:  Procedure Laterality Date   ABDOMINAL HYSTERECTOMY     CHOLECYSTECTOMY     INNER EAR SURGERY Right    NASAL SINUS SURGERY      OB History   No obstetric history on file.      Home Medications    Prior to Admission medications   Medication Sig Start Date End Date Taking? Authorizing Provider  ipratropium-albuterol (DUONEB) 0.5-2.5 (3) MG/3ML SOLN Take 3 mLs by nebulization every 6 (six) hours as needed. 10/10/21  Yes Danton Clap, PA-C  predniSONE (DELTASONE) 20 MG tablet Take 2 tablets (40 mg total) by mouth daily for 5 days. 10/10/21 10/15/21 Yes Danton Clap, PA-C  promethazine-dextromethorphan (PROMETHAZINE-DM) 6.25-15 MG/5ML syrup Take 5 mLs by mouth 4 (four) times daily as needed. 10/10/21  Yes Laurene Footman B, PA-C  albuterol (VENTOLIN HFA) 108 (90 Base) MCG/ACT inhaler Inhale 1-2 puffs into the lungs every 4 (four) hours as needed for wheezing or shortness of breath.  06/14/20   Melynda Ripple, MD  amphetamine-dextroamphetamine (ADDERALL) 20 MG tablet Take 20 mg by mouth daily.    [provider]  benzonatate (TESSALON) 200 MG capsule Take 1 capsule (200 mg total) by mouth 3 (three) times daily as needed for cough. 06/14/20   Melynda Ripple, MD  ibuprofen (ADVIL) 600 MG tablet Take 1 tablet (600 mg total) by mouth every 6 (six) hours as needed. 06/14/20   Melynda Ripple, MD  ketoconazole (NIZORAL) 2 % cream Apply 1 application topically daily. Applied to ears and neck 05/30/18   Lorin Picket, PA-C  ketoconazole (NIZORAL) 2 % shampoo Apply 1 application topically 2 (two) times a week. 05/30/18   Lorin Picket, PA-C  lubiprostone (AMITIZA) 8 MCG capsule Take 8 mcg by mouth 2 (two) times daily with a meal.    [provider]  simvastatin (ZOCOR) 20 MG tablet Take 20 mg by mouth daily.    [provider]  Spacer/Aero-Holding Chambers (AEROCHAMBER PLUS) inhaler Use as instructed 06/14/20   Melynda Ripple, MD  topiramate (TOPAMAX) 100 MG tablet Take 100 mg by mouth 2 (two) times daily.    [provider]  fluticasone (FLONASE) 50 MCG/ACT nasal spray Place 2 sprays into both nostrils daily. 10/27/17 06/14/20  Lorin Picket, PA-C  metoprolol tartrate (LOPRESSOR) 100 MG tablet Take  1 tablet (100 mg) by mouth x 1 dose, 2 hours prior to your Cardiac CT 12/08/19 03/01/20  Kate Sable, MD  venlafaxine XR (EFFEXOR-XR) 150 MG 24 hr capsule Take 150 mg by mouth daily with breakfast.  03/01/20  [provider]    Family History Family History  Problem Relation Age of Onset   COPD Mother    Heart disease Father    Hypertension Father    Diabetes Father    Cancer Father     Social History Social History   Tobacco Use   Smoking status: Never   Smokeless tobacco: Never  Vaping Use   Vaping Use: Never used  Substance Use Topics   Alcohol use: No   Drug use: No     Allergies   Patient has no known  allergies.   Review of Systems Review of Systems  Constitutional:  Positive for fatigue. Negative for chills, diaphoresis and fever.  HENT:  Positive for congestion and rhinorrhea. Negative for ear pain, sinus pressure, sinus pain and sore throat.   Respiratory:  Positive for cough, shortness of breath and wheezing.   Gastrointestinal:  Negative for abdominal pain, nausea and vomiting.  Musculoskeletal:  Positive for myalgias. Negative for arthralgias.  Skin:  Negative for rash.  Neurological:  Positive for headaches. Negative for weakness.  Hematological:  Negative for adenopathy.    Physical Exam Triage Vital Signs ED Triage Vitals  Enc Vitals Group     BP 10/10/21 1541 (!) 153/100     Pulse Rate 10/10/21 1541 (!) 114     Resp 10/10/21 1541 20     Temp 10/10/21 1541 98.2 F (36.8 C)     Temp src --      SpO2 10/10/21 1541 100 %     Weight 10/10/21 1542 145 lb (65.8 kg)     Height 10/10/21 1542 5' (1.524 m)     Head Circumference --      Peak Flow --      Pain Score 10/10/21 1542 5     Pain Loc --      Pain Edu? --      Excl. in [place]? --    No data found.  Updated Vital Signs BP (!) 153/100 (BP Location: Left Arm)   Pulse (!) 114   Temp 98.2 F (36.8 C)   Resp 20   Ht 5' (1.524 m)   Wt 145 lb (65.8 kg)   SpO2 100%   BMI 28.32 kg/m   Physical Exam Vitals and nursing note reviewed.  Constitutional:      General: She is not in acute distress.    Appearance: Normal appearance. She is well-developed. She is ill-appearing. She is not toxic-appearing.  HENT:     Head: Normocephalic and atraumatic.     Nose: Congestion and rhinorrhea present.     Mouth/Throat:     Mouth: Mucous membranes are moist.     Pharynx: Oropharynx is clear.  Eyes:     General: No scleral icterus.       Right eye: No discharge.        Left eye: No discharge.     Conjunctiva/sclera: Conjunctivae normal.  Cardiovascular:     Rate and Rhythm: Regular rhythm. Tachycardia present.     Heart  sounds: Normal heart sounds.  Pulmonary:     Effort: Pulmonary effort is normal. No respiratory distress.     Breath sounds: Wheezing present.     Comments: Diminished breath sounds throughout.  Few scattered wheezes, but she is audibly wheezing. Increased RR. Musculoskeletal:     Cervical back: Neck supple.  Skin:    General: Skin is dry.  Neurological:     General: No focal deficit present.     Mental Status: She is alert. Mental status is at baseline.     Motor: No weakness.     Gait: Gait normal.  Psychiatric:        Mood and Affect: Mood normal.        Behavior: Behavior normal.        Thought Content: Thought content normal.     UC Treatments / Results  Labs (all labs ordered are listed, but only abnormal results are displayed) Labs Reviewed  RESP PANEL BY RT-PCR (FLU A&B, COVID) [id number]    EKG   Radiology DG Chest 2 View  Result Date: 10/10/2021 CLINICAL DATA:  Shortness of breath, cough. EXAM: CHEST - 2 VIEW COMPARISON:  June 14, 2020. FINDINGS: The heart size and mediastinal contours are within normal limits. Both lungs are clear. The visualized skeletal structures are unremarkable. IMPRESSION: No active cardiopulmonary disease. Electronically Signed   By: Marijo Conception M.D.   On: 10/10/2021 16:44    Procedures Procedures (including critical care time)  Medications Ordered in UC Medications  albuterol (VENTOLIN HFA) 108 (90 Base) MCG/ACT inhaler 1-2 puff (2 puffs Inhalation Given 10/10/21 1619)  methylPREDNISolone sodium succinate (SOLU-MEDROL) 125 mg/2 mL injection 125 mg (125 mg Intramuscular Given 10/10/21 1620)    Initial Impression / Assessment and Plan / UC Course  I have reviewed the triage vital signs and the nursing notes.  Pertinent labs & imaging results that were available during my care of the patient were reviewed by me and considered in my medical decision making (see chart for details).  65 year old female with history of asthma presenting for  sudden onset of fatigue, body aches, cough, congestion, wheezing and shortness of breath last night.  Has used her albuterol nebulizer machine at home which has not really improved her breathing.  BP elevated 153/100 and pulse elevated at 114 bpm.  Oxygen is stable at 100%.  She is afebrile.  She is ill-appearing but nontoxic.  Nasal congestion and clearish rhinorrhea on exam.  Diminished breath sounds throughout all lung fields with few scattered wheezes but patient is audibly wheezing.  Unable to give patient nebulized breathing treatment due to our current clinic guidelines of not performing nebulized treatments during COVID-19 pandemic.  Patient was given albuterol inhaler to use.  Patient given 125 IM Solu-Medrol.  Respiratory panel obtained as well as chest x-ray.  Respiratory panel negative.  Chest x-ray independently viewed by me.  Overread confirms normal imaging.  No evidence of pneumonia.  Reviewed results with patient.  Patient seems to be breathing a little better after the albuterol inhaler.  Advised her symptoms likely due to viral illness and flareup of her asthma.  Advised her to start prednisone tomorrow.  Sent Promethazine DM to pharmacy as well as DuoNeb solution.  Advised increasing rest and fluids.  Reviewed return and ER precautions.   Final Clinical Impressions(s) / UC Diagnoses   Final diagnoses:  Exacerbation of asthma, unspecified asthma severity, unspecified whether persistent  Viral illness  Acute cough  Wheezing     Discharge Instructions      -Your flu and COVID test were negative and your chest x-ray is negative for pneumonia.  I suspect you have a really bad cold and a flareup of  her asthma.  I have sent cough medicine to the pharmacy.  We have given you a steroid injection in the clinic.  Start the prednisone tomorrow.  Increase rest and fluids.  I also sent a nebulizer solution as to different medicines in it.  Use this in place of the plain albuterol.  I  think will be more helpful while you are sick.  If your breathing is not improved with the nebulizer medication or you are feeling worse you may need to go to the emergency department or call 911.  You should be feeling better over the next week.     ED Prescriptions     Medication Sig Dispense Auth. Provider   ipratropium-albuterol (DUONEB) 0.5-2.5 (3) MG/3ML SOLN Take 3 mLs by nebulization every 6 (six) hours as needed. 60 mL Laurene Footman B, PA-C   promethazine-dextromethorphan (PROMETHAZINE-DM) 6.25-15 MG/5ML syrup Take 5 mLs by mouth 4 (four) times daily as needed. 118 mL Laurene Footman B, PA-C   predniSONE (DELTASONE) 20 MG tablet Take 2 tablets (40 mg total) by mouth daily for 5 days. 10 tablet Gretta Cool      PDMP not reviewed this encounter.   Danton Clap, PA-C 10/10/21 1725

## 2021-10-13 DIAGNOSIS — J45901 Unspecified asthma with (acute) exacerbation: Secondary | ICD-10-CM | POA: Diagnosis not present

## 2021-10-14 DIAGNOSIS — J45901 Unspecified asthma with (acute) exacerbation: Secondary | ICD-10-CM | POA: Diagnosis not present

## 2021-10-18 DIAGNOSIS — Z6826 Body mass index (BMI) 26.0-26.9, adult: Secondary | ICD-10-CM | POA: Diagnosis not present

## 2021-10-18 DIAGNOSIS — H66003 Acute suppurative otitis media without spontaneous rupture of ear drum, bilateral: Secondary | ICD-10-CM | POA: Diagnosis not present

## 2021-10-18 DIAGNOSIS — J45901 Unspecified asthma with (acute) exacerbation: Secondary | ICD-10-CM | POA: Diagnosis not present

## 2021-10-27 DIAGNOSIS — Z6827 Body mass index (BMI) 27.0-27.9, adult: Secondary | ICD-10-CM | POA: Diagnosis not present

## 2021-10-27 DIAGNOSIS — R062 Wheezing: Secondary | ICD-10-CM | POA: Diagnosis not present

## 2021-10-27 DIAGNOSIS — B3731 Acute candidiasis of vulva and vagina: Secondary | ICD-10-CM | POA: Diagnosis not present

## 2021-10-27 DIAGNOSIS — H66003 Acute suppurative otitis media without spontaneous rupture of ear drum, bilateral: Secondary | ICD-10-CM | POA: Diagnosis not present

## 2021-12-02 DIAGNOSIS — R062 Wheezing: Secondary | ICD-10-CM | POA: Diagnosis not present

## 2021-12-02 DIAGNOSIS — R69 Illness, unspecified: Secondary | ICD-10-CM | POA: Diagnosis not present

## 2021-12-02 DIAGNOSIS — R051 Acute cough: Secondary | ICD-10-CM | POA: Diagnosis not present

## 2021-12-02 DIAGNOSIS — Z79899 Other long term (current) drug therapy: Secondary | ICD-10-CM | POA: Diagnosis not present

## 2021-12-02 DIAGNOSIS — R509 Fever, unspecified: Secondary | ICD-10-CM | POA: Diagnosis not present

## 2022-02-03 DIAGNOSIS — E782 Mixed hyperlipidemia: Secondary | ICD-10-CM | POA: Diagnosis not present

## 2022-02-03 DIAGNOSIS — J441 Chronic obstructive pulmonary disease with (acute) exacerbation: Secondary | ICD-10-CM | POA: Diagnosis not present

## 2022-02-21 DIAGNOSIS — B309 Viral conjunctivitis, unspecified: Secondary | ICD-10-CM | POA: Diagnosis not present

## 2022-02-21 DIAGNOSIS — Z6828 Body mass index (BMI) 28.0-28.9, adult: Secondary | ICD-10-CM | POA: Diagnosis not present

## 2022-05-04 DIAGNOSIS — L603 Nail dystrophy: Secondary | ICD-10-CM | POA: Diagnosis not present

## 2022-05-04 DIAGNOSIS — R69 Illness, unspecified: Secondary | ICD-10-CM | POA: Diagnosis not present

## 2022-05-04 DIAGNOSIS — Z6829 Body mass index (BMI) 29.0-29.9, adult: Secondary | ICD-10-CM | POA: Diagnosis not present

## 2022-05-04 DIAGNOSIS — Z1389 Encounter for screening for other disorder: Secondary | ICD-10-CM | POA: Diagnosis not present

## 2022-05-04 DIAGNOSIS — Z1331 Encounter for screening for depression: Secondary | ICD-10-CM | POA: Diagnosis not present

## 2022-05-05 DIAGNOSIS — J441 Chronic obstructive pulmonary disease with (acute) exacerbation: Secondary | ICD-10-CM | POA: Diagnosis not present

## 2022-05-05 DIAGNOSIS — E782 Mixed hyperlipidemia: Secondary | ICD-10-CM | POA: Diagnosis not present

## 2022-05-12 DIAGNOSIS — R69 Illness, unspecified: Secondary | ICD-10-CM | POA: Diagnosis not present

## 2022-05-12 DIAGNOSIS — F900 Attention-deficit hyperactivity disorder, predominantly inattentive type: Secondary | ICD-10-CM | POA: Diagnosis not present

## 2022-08-05 DIAGNOSIS — E782 Mixed hyperlipidemia: Secondary | ICD-10-CM | POA: Diagnosis not present

## 2022-08-05 DIAGNOSIS — J441 Chronic obstructive pulmonary disease with (acute) exacerbation: Secondary | ICD-10-CM | POA: Diagnosis not present

## 2022-08-08 DIAGNOSIS — Z6828 Body mass index (BMI) 28.0-28.9, adult: Secondary | ICD-10-CM | POA: Diagnosis not present

## 2022-08-08 DIAGNOSIS — R079 Chest pain, unspecified: Secondary | ICD-10-CM | POA: Diagnosis not present

## 2022-08-08 DIAGNOSIS — R030 Elevated blood-pressure reading, without diagnosis of hypertension: Secondary | ICD-10-CM | POA: Diagnosis not present

## 2022-08-08 DIAGNOSIS — J45901 Unspecified asthma with (acute) exacerbation: Secondary | ICD-10-CM | POA: Diagnosis not present

## 2022-09-08 DIAGNOSIS — R69 Illness, unspecified: Secondary | ICD-10-CM | POA: Diagnosis not present

## 2022-09-08 DIAGNOSIS — N760 Acute vaginitis: Secondary | ICD-10-CM | POA: Diagnosis not present

## 2022-09-08 DIAGNOSIS — F900 Attention-deficit hyperactivity disorder, predominantly inattentive type: Secondary | ICD-10-CM | POA: Diagnosis not present

## 2022-09-08 DIAGNOSIS — Z23 Encounter for immunization: Secondary | ICD-10-CM | POA: Diagnosis not present

## 2022-09-08 DIAGNOSIS — Z79899 Other long term (current) drug therapy: Secondary | ICD-10-CM | POA: Diagnosis not present

## 2022-09-08 DIAGNOSIS — Z6828 Body mass index (BMI) 28.0-28.9, adult: Secondary | ICD-10-CM | POA: Diagnosis not present

## 2022-09-12 ENCOUNTER — Emergency Department: Payer: Medicare HMO

## 2022-09-12 ENCOUNTER — Emergency Department
Admission: EM | Admit: 2022-09-12 | Discharge: 2022-09-13 | Disposition: A | Discharge status: Home / Self Care | Payer: Medicare HMO | Attending: Emergency Medicine | Admitting: Emergency Medicine

## 2022-09-12 ENCOUNTER — Other Ambulatory Visit: Payer: Self-pay

## 2022-09-12 DIAGNOSIS — Z28310 Unvaccinated for covid-19: Secondary | ICD-10-CM | POA: Diagnosis not present

## 2022-09-12 DIAGNOSIS — Z20822 Contact with and (suspected) exposure to covid-19: Secondary | ICD-10-CM | POA: Diagnosis not present

## 2022-09-12 DIAGNOSIS — R059 Cough, unspecified: Secondary | ICD-10-CM | POA: Diagnosis not present

## 2022-09-12 DIAGNOSIS — R0902 Hypoxemia: Secondary | ICD-10-CM | POA: Diagnosis not present

## 2022-09-12 DIAGNOSIS — J9601 Acute respiratory failure with hypoxia: Secondary | ICD-10-CM | POA: Insufficient documentation

## 2022-09-12 DIAGNOSIS — Z859 Personal history of malignant neoplasm, unspecified: Secondary | ICD-10-CM | POA: Insufficient documentation

## 2022-09-12 DIAGNOSIS — Z743 Need for continuous supervision: Secondary | ICD-10-CM | POA: Diagnosis not present

## 2022-09-12 DIAGNOSIS — J45901 Unspecified asthma with (acute) exacerbation: Secondary | ICD-10-CM | POA: Insufficient documentation

## 2022-09-12 DIAGNOSIS — I959 Hypotension, unspecified: Secondary | ICD-10-CM | POA: Diagnosis not present

## 2022-09-12 DIAGNOSIS — J4521 Mild intermittent asthma with (acute) exacerbation: Secondary | ICD-10-CM | POA: Diagnosis not present

## 2022-09-12 DIAGNOSIS — R0602 Shortness of breath: Secondary | ICD-10-CM | POA: Diagnosis not present

## 2022-09-12 LAB — CBC WITH DIFFERENTIAL/PLATELET
Abs Immature Granulocytes: 0.02 10*3/uL (ref 0.00–0.07)
Basophils Absolute: 0 10*3/uL (ref 0.0–0.1)
Basophils Relative: 0 %
Eosinophils Absolute: 0.1 10*3/uL (ref 0.0–0.5)
Eosinophils Relative: 1 %
HCT: 37.7 % (ref 36.0–46.0)
Hemoglobin: 12.7 g/dL (ref 12.0–15.0)
Immature Granulocytes: 0 %
Lymphocytes Relative: 38 %
Lymphs Abs: 2.8 10*3/uL (ref 0.7–4.0)
MCH: 29.9 pg (ref 26.0–34.0)
MCHC: 33.7 g/dL (ref 30.0–36.0)
MCV: 88.7 fL (ref 80.0–100.0)
Monocytes Absolute: 0.7 10*3/uL (ref 0.1–1.0)
Monocytes Relative: 10 %
Neutro Abs: 3.7 10*3/uL (ref 1.7–7.7)
Neutrophils Relative %: 51 %
Platelets: 227 10*3/uL (ref 150–400)
RBC: 4.25 MIL/uL (ref 3.87–5.11)
RDW: 13.3 % (ref 11.5–15.5)
WBC: 7.3 10*3/uL (ref 4.0–10.5)
nRBC: 0 % (ref 0.0–0.2)

## 2022-09-12 LAB — BASIC METABOLIC PANEL
Anion gap: 7 (ref 5–15)
BUN: 16 mg/dL (ref 8–23)
CO2: 25 mmol/L (ref 22–32)
Calcium: 9.1 mg/dL (ref 8.9–10.3)
Chloride: 110 mmol/L (ref 98–111)
Creatinine, Ser: 0.84 mg/dL (ref 0.44–1.00)
GFR, Estimated: >60 mL/min (ref >60)
Glucose, Bld: 177 mg/dL — ABNORMAL HIGH (ref 70–99)
Potassium: 3.1 mmol/L — ABNORMAL LOW (ref 3.5–5.1)
Sodium: 142 mmol/L (ref 135–145)

## 2022-09-12 MED ORDER — IPRATROPIUM BROMIDE 0.02 % IN SOLN
0.5000 mg | Freq: Once | RESPIRATORY_TRACT | Status: AC
  Administered 2022-09-12: 0.5 mg via RESPIRATORY_TRACT
  Filled 2022-09-12: qty 2.5

## 2022-09-12 MED ORDER — ALBUTEROL SULFATE (2.5 MG/3ML) 0.083% IN NEBU
5.0000 mg | INHALATION_SOLUTION | Freq: Once | RESPIRATORY_TRACT | Status: AC
  Administered 2022-09-12: 5 mg via RESPIRATORY_TRACT
  Filled 2022-09-12: qty 6

## 2022-09-12 NOTE — ED Triage Notes (Signed)
Pt started having an asthma attack later this evening. Pt did use albuterol and duo neb at home. Pt was given another albuterol treatment, '125mg'$  solu-medrol, and 2g magnesium in route. Pt says she has HX of asthma, seizures.

## 2022-09-12 NOTE — ED Provider Notes (Incomplete)
[facility] Provider Note    Event Date/Time   First MD Initiated Contact with Patient 09/12/22 2313     (approximate)   History   Shortness of Breath   HPI  Cynthia Fisher is a 66 y.o. female with history of asthma who presents to the emergency department with subjective fevers, chills, productive cough, shortness of breath and wheezing that started today.  States her chest feels tight.  Sats were 80% on room air at home.  Does not wear oxygen chronically.  Took 1 DuoNeb at home, EMS gave 1 albuterol treatment in route, 125 mg of IV Solu-Medrol and 2 g of IV magnesium.   History provided by patient and EMS.    Past Medical History:  Diagnosis Date  . Asthma   . Cancer ([place])   . Ear pain   . Hypercholesteremia   . Restless legs   . Seizures ([place])     Past Surgical History:  Procedure Laterality Date  . ABDOMINAL HYSTERECTOMY    . CHOLECYSTECTOMY    . INNER EAR SURGERY Right   . NASAL SINUS SURGERY      MEDICATIONS:  Prior to Admission medications   Medication Sig Start Date End Date Taking? Authorizing Provider  albuterol (VENTOLIN HFA) 108 (90 Base) MCG/ACT inhaler Inhale 1-2 puffs into the lungs every 4 (four) hours as needed for wheezing or shortness of breath. 06/14/20   Melynda Ripple, MD  amphetamine-dextroamphetamine (ADDERALL) 20 MG tablet Take 20 mg by mouth daily.    [provider]  benzonatate (TESSALON) 200 MG capsule Take 1 capsule (200 mg total) by mouth 3 (three) times daily as needed for cough. 06/14/20   Melynda Ripple, MD  ibuprofen (ADVIL) 600 MG tablet Take 1 tablet (600 mg total) by mouth every 6 (six) hours as needed. 06/14/20   Melynda Ripple, MD  ipratropium-albuterol (DUONEB) 0.5-2.5 (3) MG/3ML SOLN Take 3 mLs by nebulization every 6 (six) hours as needed. 10/10/21   Danton Clap, PA-C  ketoconazole (NIZORAL) 2 % cream Apply 1 application topically daily. Applied to ears and neck 05/30/18   Lorin Picket, PA-C  ketoconazole (NIZORAL) 2 % shampoo Apply 1 application topically 2 (two) times a week. 05/30/18   Lorin Picket, PA-C  lubiprostone (AMITIZA) 8 MCG capsule Take 8 mcg by mouth 2 (two) times daily with a meal.    [provider]  promethazine-dextromethorphan (PROMETHAZINE-DM) 6.25-15 MG/5ML syrup Take 5 mLs by mouth 4 (four) times daily as needed. 10/10/21   Danton Clap, PA-C  simvastatin (ZOCOR) 20 MG tablet Take 20 mg by mouth daily.    [provider]  Spacer/Aero-Holding Chambers (AEROCHAMBER PLUS) inhaler Use as instructed 06/14/20   Melynda Ripple, MD  topiramate (TOPAMAX) 100 MG tablet Take 100 mg by mouth 2 (two) times daily.    [provider]  fluticasone (FLONASE) 50 MCG/ACT nasal spray Place 2 sprays into both nostrils daily. 10/27/17 06/14/20  Lorin Picket, PA-C  metoprolol tartrate (LOPRESSOR) 100 MG tablet Take 1 tablet (100 mg) by mouth x 1 dose, 2 hours prior to your Cardiac CT 12/08/19 03/01/20  Kate Sable, MD  venlafaxine XR (EFFEXOR-XR) 150 MG 24 hr capsule Take 150 mg by mouth daily with breakfast.  03/01/20  [provider]    Physical Exam   Triage Vital Signs: ED Triage Vitals  Enc Vitals Group     BP 09/12/22 2320 (!) 160/71     Pulse Rate  09/12/22 2320 93     Resp 09/12/22 2320 (!) 25     Temp 09/12/22 2320 98.2 F (36.8 C)     Temp Source 09/12/22 2320 Oral     SpO2 09/12/22 2320 100 %     Weight 09/12/22 2321 153 lb 3.2 oz (69.5 kg)     Height --      Head Circumference --      Peak Flow --      Pain Score --      Pain Loc --      Pain Edu? --      Excl. in [place]? --     Most recent vital signs: Vitals:   09/12/22 2320  BP: (!) 160/71  Pulse: 93  Resp: (!) 25  Temp: 98.2 F (36.8 C)  SpO2: 100%    CONSTITUTIONAL: Alert and oriented and responds appropriately to questions. Well-appearing; well-nourished HEAD: Normocephalic, atraumatic EYES: Conjunctivae clear, pupils appear equal,  sclera nonicteric ENT: normal nose; moist mucous membranes NECK: Supple, normal ROM CARD: RRR; S1 and S2 appreciated; no murmurs, no clicks, no rubs, no gallops RESP: Normal chest excursion without splinting or tachypnea; breath sounds clear and equal bilaterally; no wheezes, no rhonchi, no rales, no hypoxia or respiratory distress, speaking full sentences ABD/GI: Normal bowel sounds; non-distended; soft, non-tender, no rebound, no guarding, no peritoneal signs BACK: The back appears normal EXT: Normal ROM in all joints; no deformity noted, no edema; no cyanosis SKIN: Normal color for age and race; warm; no rash on exposed skin NEURO: Moves all extremities equally, normal speech PSYCH: The patient's mood and manner are appropriate.   ED Results / Procedures / Treatments   LABS: (all labs ordered are listed, but only abnormal results are displayed) Labs Reviewed  SARS CORONAVIRUS 2 BY RT PCR  CBC WITH DIFFERENTIAL/PLATELET  BASIC METABOLIC PANEL     EKG:  EKG Interpretation  Date/Time:    Ventricular Rate:    PR Interval:    QRS Duration:   QT Interval:    QTC Calculation:   R Axis:     Text Interpretation:           RADIOLOGY: My personal review and interpretation of imaging:  ***  I have personally reviewed all radiology reports.   No results found.   PROCEDURES:  Critical Care performed: {CriticalCareYesNo:19197::"Yes, see critical care procedure note(s)","No"}   CRITICAL CARE Performed by: Pryor Curia   Total critical care time: *** minutes  Critical care time was exclusive of separately billable procedures and treating other patients.  Critical care was necessary to treat or prevent imminent or life-threatening deterioration.  Critical care was time spent personally by me on the following activities: development of treatment plan with patient and/or surrogate as well as nursing, discussions with consultants, evaluation of patient's response to  treatment, examination of patient, obtaining history from patient or surrogate, ordering and performing treatments and interventions, ordering and review of laboratory studies, ordering and review of radiographic studies, pulse oximetry and re-evaluation of patient's condition.   Procedures    IMPRESSION / MDM / ASSESSMENT AND PLAN / ED COURSE  I reviewed the triage vital signs and the nursing notes.    ***  The patient is on the cardiac monitor to evaluate for evidence of arrhythmia and/or significant heart rate changes.   DIFFERENTIAL DIAGNOSIS (includes but not limited to):   ***   Patient's presentation is most consistent with {EM COPA:27473}   PLAN: ***   MEDICATIONS  GIVEN IN ED: Medications  albuterol (PROVENTIL) (2.5 MG/3ML) 0.083% nebulizer solution 5 mg (5 mg Nebulization Given 09/12/22 2333)  ipratropium (ATROVENT) nebulizer solution 0.5 mg (0.5 mg Nebulization Given 09/12/22 2333)     ED COURSE:  ***   CONSULTS:  ***   OUTSIDE RECORDS REVIEWED:  ***       FINAL CLINICAL IMPRESSION(S) / ED DIAGNOSES   Final diagnoses:  None     Rx / DC Orders   ED Discharge Orders     None        Note:  This document was prepared using Dragon voice recognition software and may include unintentional dictation errors.

## 2022-09-12 NOTE — ED Provider Notes (Signed)
[facility] Provider Note    Event Date/Time   First MD Initiated Contact with Patient 09/12/22 2313     (approximate)   History   Shortness of Breath   HPI  Cynthia Fisher is a 66 y.o. female with history of asthma who presents to the emergency department with subjective fevers, chills, productive cough, shortness of breath and wheezing that started today.  States her chest feels tight.  Sats were 80% on room air at home.  Does not wear oxygen chronically.  Took 1 DuoNeb at home, EMS gave 1 albuterol treatment in route, 125 mg of IV Solu-Medrol and 2 g of IV magnesium.  States she has had a flu shot this year but is unvaccinated against COVID-19.   History provided by patient and EMS.    Past Medical History:  Diagnosis Date   Asthma    Cancer ([place])    Ear pain    Hypercholesteremia    Restless legs    Seizures ([facility])     Past Surgical History:  Procedure Laterality Date   ABDOMINAL HYSTERECTOMY     CHOLECYSTECTOMY     INNER EAR SURGERY Right    NASAL SINUS SURGERY      MEDICATIONS:  Prior to Admission medications   Medication Sig Start Date End Date Taking? Authorizing Provider  albuterol (VENTOLIN HFA) 108 (90 Base) MCG/ACT inhaler Inhale 1-2 puffs into the lungs every 4 (four) hours as needed for wheezing or shortness of breath. 06/14/20   Melynda Ripple, MD  amphetamine-dextroamphetamine (ADDERALL) 20 MG tablet Take 20 mg by mouth daily.    [provider]  benzonatate (TESSALON) 200 MG capsule Take 1 capsule (200 mg total) by mouth 3 (three) times daily as needed for cough. 06/14/20   Melynda Ripple, MD  ibuprofen (ADVIL) 600 MG tablet Take 1 tablet (600 mg total) by mouth every 6 (six) hours as needed. 06/14/20   Melynda Ripple, MD  ipratropium-albuterol (DUONEB) 0.5-2.5 (3) MG/3ML SOLN Take 3 mLs by nebulization every 6 (six) hours as needed. 10/10/21   Danton Clap, PA-C  ketoconazole (NIZORAL) 2 % cream Apply 1  application topically daily. Applied to ears and neck 05/30/18   Lorin Picket, PA-C  ketoconazole (NIZORAL) 2 % shampoo Apply 1 application topically 2 (two) times a week. 05/30/18   Lorin Picket, PA-C  lubiprostone (AMITIZA) 8 MCG capsule Take 8 mcg by mouth 2 (two) times daily with a meal.    [provider]  promethazine-dextromethorphan (PROMETHAZINE-DM) 6.25-15 MG/5ML syrup Take 5 mLs by mouth 4 (four) times daily as needed. 10/10/21   Danton Clap, PA-C  simvastatin (ZOCOR) 20 MG tablet Take 20 mg by mouth daily.    [provider]  Spacer/Aero-Holding Chambers (AEROCHAMBER PLUS) inhaler Use as instructed 06/14/20   Melynda Ripple, MD  topiramate (TOPAMAX) 100 MG tablet Take 100 mg by mouth 2 (two) times daily.    [provider]  fluticasone (FLONASE) 50 MCG/ACT nasal spray Place 2 sprays into both nostrils daily. 10/27/17 06/14/20  Lorin Picket, PA-C  metoprolol tartrate (LOPRESSOR) 100 MG tablet Take 1 tablet (100 mg) by mouth x 1 dose, 2 hours prior to your Cardiac CT 12/08/19 03/01/20  Kate Sable, MD  venlafaxine XR (EFFEXOR-XR) 150 MG 24 hr capsule Take 150 mg by mouth daily with breakfast.  03/01/20  [provider]    Physical Exam   Triage Vital Signs: ED Triage Vitals  Enc Vitals Group  BP 09/12/22 2320 (!) 160/71     Pulse Rate 09/12/22 2320 93     Resp 09/12/22 2320 (!) 25     Temp 09/12/22 2320 98.2 F (36.8 C)     Temp Source 09/12/22 2320 Oral     SpO2 09/12/22 2320 100 %     Weight 09/12/22 2321 153 lb 3.2 oz (69.5 kg)     Height --      Head Circumference --      Peak Flow --      Pain Score --      Pain Loc --      Pain Edu? --      Excl. in [place]? --     Most recent vital signs: Vitals:   09/13/22 0200 09/13/22 0203  BP: (!) 153/67   Pulse: (!) 125 96  Resp:    Temp:    SpO2: 100% 98%    CONSTITUTIONAL: Alert and oriented and responds appropriately to questions. Well-appearing;  well-nourished HEAD: Normocephalic, atraumatic EYES: Conjunctivae clear, pupils appear equal, sclera nonicteric ENT: normal nose; moist mucous membranes NECK: Supple, normal ROM CARD: RRR; S1 and S2 appreciated; no murmurs, no clicks, no rubs, no gallops RESP: Patient satting 100% on nonrebreather receiving a breathing treatment.  She is slightly tachypneic but speaking full sentences.  She has diffuse inspiratory and expiratory wheezing and diminished aeration at her bases.  No rhonchi or rales. ABD/GI: Normal bowel sounds; non-distended; soft, non-tender, no rebound, no guarding, no peritoneal signs BACK: The back appears normal EXT: Normal ROM in all joints; no deformity noted, no edema; no cyanosis, no calf tenderness or calf swelling SKIN: Normal color for age and race; warm; no rash on exposed skin NEURO: Moves all extremities equally, normal speech PSYCH: The patient's mood and manner are appropriate.   ED Results / Procedures / Treatments   LABS: (all labs ordered are listed, but only abnormal results are displayed) Labs Reviewed  BASIC METABOLIC PANEL - Abnormal; Notable for the following components:      Result Value   Potassium 3.1 (*)    Glucose, Bld 177 (*)    All other components within normal limits  SARS CORONAVIRUS 2 BY RT PCR  CBC WITH DIFFERENTIAL/PLATELET     EKG:  EKG Interpretation  Date/Time:  Tuesday September 12 2022 23:19:20 EST Ventricular Rate:  91 PR Interval:  134 QRS Duration: 132 QT Interval:  383 QTC Calculation: 472 R Axis:   6 Text Interpretation: Sinus rhythm Nonspecific intraventricular conduction delay Confirmed by Pryor Curia [phone]) on 09/13/2022 12:09:56 AM         RADIOLOGY: My personal review and interpretation of imaging: Chest x-ray clear.  I have personally reviewed all radiology reports.   DG Chest Portable 1 View  Result Date: 09/13/2022 CLINICAL DATA:  Cough and shortness of breath. EXAM: PORTABLE CHEST 1 VIEW  COMPARISON:  October 10, 2021 FINDINGS: The heart size and mediastinal contours are within normal limits. Low lung volumes are noted. Mild, chronic appearing increased lung markings are seen within the bilateral lung bases. There is no evidence of acute infiltrate, pleural effusion or pneumothorax. Radiopaque surgical clips are seen within the right upper quadrant. The visualized skeletal structures are unremarkable. IMPRESSION: Low lung volumes without evidence of acute or active cardiopulmonary disease. Electronically Signed   By: Virgina Norfolk M.D.   On: 09/13/2022 00:28     PROCEDURES:  Critical Care performed: Yes, see critical care procedure note(s)   CRITICAL CARE  Performed by: Pryor Curia   Total critical care time: 45 minutes  Critical care time was exclusive of separately billable procedures and treating other patients.  Critical care was necessary to treat or prevent imminent or life-threatening deterioration.  Critical care was time spent personally by me on the following activities: development of treatment plan with patient and/or surrogate as well as nursing, discussions with consultants, evaluation of patient's response to treatment, examination of patient, obtaining history from patient or surrogate, ordering and performing treatments and interventions, ordering and review of laboratory studies, ordering and review of radiographic studies, pulse oximetry and re-evaluation of patient's condition.   Marland Kitchen1-3 Lead EKG Interpretation  Performed by: Taylie Helder, Delice Bison, DO Authorized by: Dlisa Barnwell, Delice Bison, DO     Interpretation: normal     ECG rate:  93   ECG rate assessment: normal     Rhythm: sinus rhythm     Ectopy: none     Conduction: normal       IMPRESSION / MDM / ASSESSMENT AND PLAN / ED COURSE  I reviewed the triage vital signs and the nursing notes.    Patient here with asthma exacerbation reports subjective fevers, chills, productive cough.  The patient is  on the cardiac monitor to evaluate for evidence of arrhythmia and/or significant heart rate changes.   DIFFERENTIAL DIAGNOSIS (includes but not limited to):   Asthma, pneumonia, viral URI, doubt CHF, ACS, PE   Patient's presentation is most consistent with acute presentation with potential threat to life or bodily function.   PLAN: We will continue breathing treatments here.  We will obtain CBC, BMP, EKG, chest x-ray, COVID and flu swabs.   MEDICATIONS GIVEN IN ED: Medications  albuterol (PROVENTIL) (2.5 MG/3ML) 0.083% nebulizer solution 5 mg (5 mg Nebulization Given 09/12/22 2333)  ipratropium (ATROVENT) nebulizer solution 0.5 mg (0.5 mg Nebulization Given 09/12/22 2333)  albuterol (PROVENTIL) (2.5 MG/3ML) 0.083% nebulizer solution 5 mg (5 mg Nebulization Given 09/13/22 0028)  ipratropium (ATROVENT) nebulizer solution 0.5 mg (0.5 mg Nebulization Given 09/13/22 0028)  potassium chloride SA (KLOR-CON M) CR tablet 40 mEq (40 mEq Oral Given 09/13/22 0027)     ED COURSE: Labs show no leukocytosis, normal hemoglobin.  Potassium slightly low at 3.1.  Given replacement.  COVID-negative.  Chest x-ray reviewed and interpreted by myself and the radiologist and shows no infiltrate or edema.  Patient feeling better after breathing treatments and lungs are now clear to auscultation.  She is satting 100% on room air at rest.  Will obtain ambulatory sat but anticipate discharge home.  Oxygen saturations stayed 97% or above with ambulation.  Will discharge home with refills of her medications and a steroid burst.  At this time, I do not feel there is any life-threatening condition present. I reviewed all nursing notes, vitals, pertinent previous records.  All lab and urine results, EKGs, imaging ordered have been independently reviewed and interpreted by myself.  I reviewed all available radiology reports from any imaging ordered this visit.  Based on my assessment, I feel the patient is safe to be discharged  home without further emergent workup and can continue workup as an outpatient as needed. Discussed all findings, treatment plan as well as usual and customary return precautions.  They verbalize understanding and are comfortable with this plan.  Outpatient follow-up has been provided as needed.  All questions have been answered.   CONSULTS: Admission considered but patient improving with no hypoxia now and safe for discharge home with outpatient follow-up.  OUTSIDE RECORDS REVIEWED: Reviewed patient's last family medicine note at [facility] in November 2014.       FINAL CLINICAL IMPRESSION(S) / ED DIAGNOSES   Final diagnoses:  Exacerbation of intermittent asthma, unspecified asthma severity  Acute respiratory failure with hypoxia ([place])     Rx / DC Orders   ED Discharge Orders          Ordered    albuterol (VENTOLIN HFA) 108 (90 Base) MCG/ACT inhaler  Every 4 hours PRN        09/13/22 0145    ipratropium-albuterol (DUONEB) 0.5-2.5 (3) MG/3ML SOLN  Every 6 hours PRN        09/13/22 0145    predniSONE (DELTASONE) 20 MG tablet  Daily        09/13/22 0145             Note:  This document was prepared using Dragon voice recognition software and may include unintentional dictation errors.   Phillippa Straub, Delice Bison, DO 09/13/22 [phone]

## 2022-09-13 DIAGNOSIS — R079 Chest pain, unspecified: Secondary | ICD-10-CM | POA: Diagnosis not present

## 2022-09-13 DIAGNOSIS — R0602 Shortness of breath: Secondary | ICD-10-CM | POA: Diagnosis not present

## 2022-09-13 DIAGNOSIS — R059 Cough, unspecified: Secondary | ICD-10-CM | POA: Diagnosis not present

## 2022-09-13 LAB — SARS CORONAVIRUS 2 BY RT PCR: SARS Coronavirus 2 by RT PCR: NEGATIVE

## 2022-09-13 MED ORDER — POTASSIUM CHLORIDE CRYS ER 20 MEQ PO TBCR
40.0000 meq | EXTENDED_RELEASE_TABLET | Freq: Once | ORAL | Status: AC
  Administered 2022-09-13: 40 meq via ORAL
  Filled 2022-09-13: qty 2

## 2022-09-13 MED ORDER — IPRATROPIUM BROMIDE 0.02 % IN SOLN
0.5000 mg | Freq: Once | RESPIRATORY_TRACT | Status: AC
  Administered 2022-09-13: 0.5 mg via RESPIRATORY_TRACT
  Filled 2022-09-13: qty 2.5

## 2022-09-13 MED ORDER — ALBUTEROL SULFATE HFA 108 (90 BASE) MCG/ACT IN AERS: 1.0000 | INHALATION_SPRAY | RESPIRATORY_TRACT | 0 refills | Status: AC | PRN

## 2022-09-13 MED ORDER — IPRATROPIUM-ALBUTEROL 0.5-2.5 (3) MG/3ML IN SOLN: 3.0000 mL | Freq: Four times a day (QID) | RESPIRATORY_TRACT | 0 refills | Status: AC | PRN

## 2022-09-13 MED ORDER — PREDNISONE 20 MG PO TABS: 60.0000 mg | ORAL_TABLET | Freq: Every day | ORAL | 0 refills | Status: AC

## 2022-09-13 MED ORDER — ALBUTEROL SULFATE (2.5 MG/3ML) 0.083% IN NEBU
5.0000 mg | INHALATION_SOLUTION | Freq: Once | RESPIRATORY_TRACT | Status: AC
Start: 2022-09-13 — End: 2022-09-13
  Administered 2022-09-13: 5 mg via RESPIRATORY_TRACT
  Filled 2022-09-13: qty 6

## 2022-09-13 NOTE — ED Notes (Signed)
Pt is A&Ox4. Pt has expiratory wheezing with labored breathing. Pt has HX of asthma and seizures. Pt began having an asthma attack this evening. Pt did a duo neb and albuterol inhaler prior to EMS arrival. EMS gave pt '125mg'$  Solu-medrol, 2g mag, and albuterol neb. Pt has 2 individuals at bedside and has been making phone calls to all her family and friends.

## 2022-09-13 NOTE — ED Notes (Signed)
Pt was ambulated and pt was able to maintain SpO2 97% and above.

## 2022-09-13 NOTE — ED Notes (Signed)
Pt is off all supplemental O2 and is satting at 99%. Pt has completed two Duo nebs

## 2022-10-02 ENCOUNTER — Ambulatory Visit
Admission: EM | Admit: 2022-10-02 | Discharge: 2022-10-02 | Disposition: A | Discharge status: Home / Self Care | Payer: Medicare HMO | Attending: Urgent Care | Admitting: Urgent Care

## 2022-10-02 ENCOUNTER — Encounter: Payer: Self-pay | Admitting: Emergency Medicine

## 2022-10-02 DIAGNOSIS — H66002 Acute suppurative otitis media without spontaneous rupture of ear drum, left ear: Secondary | ICD-10-CM | POA: Diagnosis not present

## 2022-10-02 DIAGNOSIS — J4521 Mild intermittent asthma with (acute) exacerbation: Secondary | ICD-10-CM | POA: Diagnosis not present

## 2022-10-02 DIAGNOSIS — J069 Acute upper respiratory infection, unspecified: Secondary | ICD-10-CM

## 2022-10-02 MED ORDER — AZITHROMYCIN 250 MG PO TABS: ORAL_TABLET | ORAL | 0 refills | Status: AC

## 2022-10-02 MED ORDER — MONTELUKAST SODIUM 10 MG PO TABS: 10.0000 mg | ORAL_TABLET | Freq: Every day | ORAL | 0 refills | Status: AC

## 2022-10-02 NOTE — ED Triage Notes (Signed)
Pt was seen in the ER on 09/12/22 and is not better. She c/o cough, fever, chest congestion and loss of voice.

## 2022-10-02 NOTE — Discharge Instructions (Signed)
You appear to be having an intermittent asthma exacerbation in combination with a URI. You also have An ear infection.  Please start taking the azithromycin as prescribed per pack directions. Please start taking the montelukast at nighttime, take until symptoms resolve. Please monitor the suspected lymph node on the left side, and follow-up with your PCP if it remains despite treatment.

## 2022-10-02 NOTE — ED Provider Notes (Incomplete)
[facility]    CSN: [id number] Arrival date & time: 10/02/22  1253      History   Chief Complaint Chief Complaint  Patient presents with   Cough   Laryngitis   Fever    HPI Cynthia Fisher is a 66 y.o. female.   Pleasant 66 year old female with a known history of asthma presents today due to continued URI symptoms.  Patient was seen in ER on 09/12/22.  Had an extensive workup at that time, was discharged home with prednisone, DuoNebs, albuterol.  States she has taken all the treatments as prescribed, but continues to have symptoms.  Chest feels full, head is full and R ear throbs.  Denies fever.   Cough Associated symptoms: fever   Fever Associated symptoms: cough     Past Medical History:  Diagnosis Date   Asthma    Cancer ([place])    Ear pain    Hypercholesteremia    Restless legs    Seizures ([place])     Patient Active Problem List   Diagnosis Date Noted   Acute bronchitis 08/07/2017    Past Surgical History:  Procedure Laterality Date   ABDOMINAL HYSTERECTOMY     CHOLECYSTECTOMY     INNER EAR SURGERY Right    NASAL SINUS SURGERY      OB History   No obstetric history on file.      Home Medications    Prior to Admission medications   Medication Sig Start Date End Date Taking? Authorizing Provider  albuterol (VENTOLIN HFA) 108 (90 Base) MCG/ACT inhaler Inhale 1-2 puffs into the lungs every 4 (four) hours as needed for wheezing or shortness of breath. 09/13/22   Ward, Delice Bison, DO  amphetamine-dextroamphetamine (ADDERALL) 20 MG tablet Take 20 mg by mouth daily.    [provider]  benzonatate (TESSALON) 200 MG capsule Take 1 capsule (200 mg total) by mouth 3 (three) times daily as needed for cough. 06/14/20   Melynda Ripple, MD  ibuprofen (ADVIL) 600 MG tablet Take 1 tablet (600 mg total) by mouth every 6 (six) hours as needed. 06/14/20   Melynda Ripple, MD  ipratropium-albuterol (DUONEB) 0.5-2.5 (3) MG/3ML SOLN Take 3 mLs by  nebulization every 6 (six) hours as needed. 09/13/22   Ward, Delice Bison, DO  ketoconazole (NIZORAL) 2 % cream Apply 1 application topically daily. Applied to ears and neck 05/30/18   Lorin Picket, PA-C  ketoconazole (NIZORAL) 2 % shampoo Apply 1 application topically 2 (two) times a week. 05/30/18   Lorin Picket, PA-C  lubiprostone (AMITIZA) 8 MCG capsule Take 8 mcg by mouth 2 (two) times daily with a meal.    [provider]  predniSONE (DELTASONE) 20 MG tablet Take 3 tablets (60 mg total) by mouth daily. 09/13/22   Ward, Delice Bison, DO  promethazine-dextromethorphan (PROMETHAZINE-DM) 6.25-15 MG/5ML syrup Take 5 mLs by mouth 4 (four) times daily as needed. 10/10/21   Danton Clap, PA-C  simvastatin (ZOCOR) 20 MG tablet Take 20 mg by mouth daily.    [provider]  Spacer/Aero-Holding Chambers (AEROCHAMBER PLUS) inhaler Use as instructed 06/14/20   Melynda Ripple, MD  topiramate (TOPAMAX) 100 MG tablet Take 100 mg by mouth 2 (two) times daily.    [provider]  fluticasone (FLONASE) 50 MCG/ACT nasal spray Place 2 sprays into both nostrils daily. 10/27/17 06/14/20  Lorin Picket, PA-C  metoprolol tartrate (LOPRESSOR) 100 MG tablet Take 1 tablet (100 mg) by mouth x 1 dose,  2 hours prior to your Cardiac CT 12/08/19 03/01/20  Kate Sable, MD  venlafaxine XR (EFFEXOR-XR) 150 MG 24 hr capsule Take 150 mg by mouth daily with breakfast.  03/01/20  [provider]    Family History Family History  Problem Relation Age of Onset   COPD Mother    Heart disease Father    Hypertension Father    Diabetes Father    Cancer Father     Social History Social History   Tobacco Use   Smoking status: Never   Smokeless tobacco: Never  Vaping Use   Vaping Use: Never used  Substance Use Topics   Alcohol use: No   Drug use: No     Allergies   Patient has no known allergies.   Review of Systems Review of Systems  Constitutional:  Positive for fever.   Respiratory:  Positive for cough.      Physical Exam Triage Vital Signs ED Triage Vitals  Enc Vitals Group     BP 10/02/22 1343 (!) 173/76     Pulse Rate 10/02/22 1343 70     Resp 10/02/22 1343 18     Temp 10/02/22 1343 97.7 F (36.5 C)     Temp Source 10/02/22 1343 Oral     SpO2 10/02/22 1343 100 %     Weight --      Height --      Head Circumference --      Peak Flow --      Pain Score 10/02/22 1344 7     Pain Loc --      Pain Edu? --      Excl. in [place]? --    No data found.  Updated Vital Signs BP (!) 173/76 (BP Location: Left Arm)   Pulse 70   Temp 97.7 F (36.5 C) (Oral)   Resp 18   SpO2 100%   Visual Acuity Right Eye Distance:   Left Eye Distance:   Bilateral Distance:    Right Eye Near:   Left Eye Near:    Bilateral Near:     Physical Exam   UC Treatments / Results  Labs (all labs ordered are listed, but only abnormal results are displayed) Labs Reviewed - No data to display  EKG   Radiology No results found.  Procedures Procedures (including critical care time)  Medications Ordered in UC Medications - No data to display  Initial Impression / Assessment and Plan / UC Course  I have reviewed the triage vital signs and the nursing notes.  Pertinent labs & imaging results that were available during my care of the patient were reviewed by me and considered in my medical decision making (see chart for details).     *** Final Clinical Impressions(s) / UC Diagnoses   Final diagnoses:  None   Discharge Instructions   None    ED Prescriptions   None    PDMP not reviewed this encounter.

## 2022-10-03 DIAGNOSIS — E782 Mixed hyperlipidemia: Secondary | ICD-10-CM | POA: Diagnosis not present

## 2022-10-03 DIAGNOSIS — J441 Chronic obstructive pulmonary disease with (acute) exacerbation: Secondary | ICD-10-CM | POA: Diagnosis not present

## 2022-10-05 DIAGNOSIS — F5104 Psychophysiologic insomnia: Secondary | ICD-10-CM | POA: Diagnosis not present

## 2022-10-05 DIAGNOSIS — F900 Attention-deficit hyperactivity disorder, predominantly inattentive type: Secondary | ICD-10-CM | POA: Diagnosis not present

## 2022-10-05 DIAGNOSIS — Z6828 Body mass index (BMI) 28.0-28.9, adult: Secondary | ICD-10-CM | POA: Diagnosis not present

## 2022-10-05 DIAGNOSIS — J4530 Mild persistent asthma, uncomplicated: Secondary | ICD-10-CM | POA: Diagnosis not present

## 2022-10-05 DIAGNOSIS — R69 Illness, unspecified: Secondary | ICD-10-CM | POA: Diagnosis not present

## 2022-10-05 DIAGNOSIS — E782 Mixed hyperlipidemia: Secondary | ICD-10-CM | POA: Diagnosis not present

## 2022-10-05 DIAGNOSIS — F3170 Bipolar disorder, currently in remission, most recent episode unspecified: Secondary | ICD-10-CM | POA: Diagnosis not present

## 2022-10-05 DIAGNOSIS — Z0001 Encounter for general adult medical examination with abnormal findings: Secondary | ICD-10-CM | POA: Diagnosis not present

## 2022-10-05 DIAGNOSIS — I10 Essential (primary) hypertension: Secondary | ICD-10-CM | POA: Diagnosis not present

## 2022-10-05 DIAGNOSIS — Z1331 Encounter for screening for depression: Secondary | ICD-10-CM | POA: Diagnosis not present

## 2022-10-05 DIAGNOSIS — J441 Chronic obstructive pulmonary disease with (acute) exacerbation: Secondary | ICD-10-CM | POA: Diagnosis not present

## 2022-10-05 DIAGNOSIS — Z1389 Encounter for screening for other disorder: Secondary | ICD-10-CM | POA: Diagnosis not present

## 2022-10-05 DIAGNOSIS — G43009 Migraine without aura, not intractable, without status migrainosus: Secondary | ICD-10-CM | POA: Diagnosis not present

## 2022-10-09 ENCOUNTER — Other Ambulatory Visit: Payer: Self-pay | Admitting: Family Medicine

## 2022-10-09 DIAGNOSIS — Z780 Asymptomatic menopausal state: Secondary | ICD-10-CM

## 2022-10-09 DIAGNOSIS — Z13820 Encounter for screening for osteoporosis: Secondary | ICD-10-CM

## 2022-10-09 DIAGNOSIS — Z1231 Encounter for screening mammogram for malignant neoplasm of breast: Secondary | ICD-10-CM
